# Patient Record
Sex: Female | Born: 1970 | Race: White | Hispanic: No | Marital: Married | Smoking: Never smoker
Health system: Southern US, Community
[De-identification: ages and names within clinical notes are randomized; demographics above are authoritative.]

## PROBLEM LIST (undated history)

## (undated) DIAGNOSIS — T7840XA Allergy, unspecified, initial encounter: Secondary | ICD-10-CM

## (undated) DIAGNOSIS — Z87898 Personal history of other specified conditions: Secondary | ICD-10-CM

## (undated) DIAGNOSIS — K219 Gastro-esophageal reflux disease without esophagitis: Secondary | ICD-10-CM

## (undated) DIAGNOSIS — O2620 Pregnancy care for patient with recurrent pregnancy loss, unspecified trimester: Secondary | ICD-10-CM

## (undated) DIAGNOSIS — O99810 Abnormal glucose complicating pregnancy: Secondary | ICD-10-CM

## (undated) DIAGNOSIS — O0900 Supervision of pregnancy with history of infertility, unspecified trimester: Secondary | ICD-10-CM

## (undated) DIAGNOSIS — Z8619 Personal history of other infectious and parasitic diseases: Secondary | ICD-10-CM

## (undated) HISTORY — PX: WISDOM TOOTH EXTRACTION: SHX21

## (undated) HISTORY — DX: Allergy, unspecified, initial encounter: T78.40XA

## (undated) HISTORY — DX: Gastro-esophageal reflux disease without esophagitis: K21.9

## (undated) HISTORY — PX: DILATION AND CURETTAGE OF UTERUS: SHX78

---

## 2002-11-03 ENCOUNTER — Other Ambulatory Visit: Admission: RE | Admit: 2002-11-03 | Discharge: 2002-11-03 | Payer: Self-pay | Admitting: Obstetrics and Gynecology

## 2003-02-10 ENCOUNTER — Other Ambulatory Visit: Admission: RE | Admit: 2003-02-10 | Discharge: 2003-02-10 | Payer: Self-pay | Admitting: Obstetrics and Gynecology

## 2003-11-11 ENCOUNTER — Other Ambulatory Visit: Admission: RE | Admit: 2003-11-11 | Discharge: 2003-11-11 | Payer: Self-pay | Admitting: Obstetrics and Gynecology

## 2007-03-30 ENCOUNTER — Encounter (INDEPENDENT_AMBULATORY_CARE_PROVIDER_SITE_OTHER): Payer: Self-pay | Admitting: Obstetrics and Gynecology

## 2007-03-30 ENCOUNTER — Ambulatory Visit (HOSPITAL_COMMUNITY): Admission: RE | Admit: 2007-03-30 | Discharge: 2007-03-30 | Payer: Self-pay | Admitting: Obstetrics and Gynecology

## 2007-11-23 ENCOUNTER — Inpatient Hospital Stay (HOSPITAL_COMMUNITY): Admission: AD | Admit: 2007-11-23 | Discharge: 2007-11-23 | Payer: Self-pay | Admitting: Obstetrics and Gynecology

## 2008-02-28 ENCOUNTER — Ambulatory Visit (HOSPITAL_COMMUNITY): Admission: RE | Admit: 2008-02-28 | Discharge: 2008-02-28 | Payer: Self-pay | Admitting: Obstetrics and Gynecology

## 2008-08-29 ENCOUNTER — Inpatient Hospital Stay (HOSPITAL_COMMUNITY): Admission: AD | Admit: 2008-08-29 | Discharge: 2008-08-29 | Payer: Self-pay | Admitting: Obstetrics and Gynecology

## 2008-09-30 ENCOUNTER — Inpatient Hospital Stay (HOSPITAL_COMMUNITY): Admission: AD | Admit: 2008-09-30 | Discharge: 2008-09-30 | Payer: Self-pay | Admitting: Obstetrics and Gynecology

## 2008-12-20 ENCOUNTER — Inpatient Hospital Stay (HOSPITAL_COMMUNITY): Admission: AD | Admit: 2008-12-20 | Discharge: 2008-12-25 | Payer: Self-pay | Admitting: Obstetrics and Gynecology

## 2010-08-27 LAB — CBC
HCT: 27.8 % — ABNORMAL LOW (ref 36.0–46.0)
HCT: 35.2 % — ABNORMAL LOW (ref 36.0–46.0)
Hemoglobin: 12.3 g/dL (ref 12.0–15.0)
Hemoglobin: 9.7 g/dL — ABNORMAL LOW (ref 12.0–15.0)
MCHC: 35 g/dL (ref 30.0–36.0)
MCV: 94.6 fL (ref 78.0–100.0)
MCV: 95.9 fL (ref 78.0–100.0)
RBC: 2.9 MIL/uL — ABNORMAL LOW (ref 3.87–5.11)
WBC: 12.5 10*3/uL — ABNORMAL HIGH (ref 4.0–10.5)
WBC: 13.9 10*3/uL — ABNORMAL HIGH (ref 4.0–10.5)

## 2010-08-27 LAB — URINE CULTURE

## 2010-08-27 LAB — RH IMMUNE GLOB WKUP(>/=20WKS)(NOT WOMEN'S HOSP)

## 2010-08-30 LAB — RH IMMUNE GLOBULIN WORKUP (NOT WOMEN'S HOSP): Antibody Screen: NEGATIVE

## 2010-08-31 LAB — WET PREP, GENITAL
Clue Cells Wet Prep HPF POC: NONE SEEN
Yeast Wet Prep HPF POC: NONE SEEN

## 2010-08-31 LAB — GC/CHLAMYDIA PROBE AMP, GENITAL: GC Probe Amp, Genital: NEGATIVE

## 2010-08-31 LAB — URINALYSIS, ROUTINE W REFLEX MICROSCOPIC
Bilirubin Urine: NEGATIVE
Hgb urine dipstick: NEGATIVE
Ketones, ur: NEGATIVE mg/dL
Nitrite: NEGATIVE
Protein, ur: NEGATIVE mg/dL

## 2010-08-31 LAB — URINE CULTURE
Colony Count: 3000
Special Requests: POSITIVE

## 2010-10-04 NOTE — Op Note (Signed)
NAME:  Julie [name], BRASIL NO.:  [id number]   MEDICAL RECORD NO.:  [id number]          PATIENT TYPE:  AMB   LOCATION:  SDC                           [facility]ILITY:  [facility]   PHYSICIAN:  Hal Morales, M.D.DATE OF BIRTH:  1970-06-07   DATE OF PROCEDURE:  03/30/2007  DATE OF DISCHARGE:                               OPERATIVE REPORT   PREOPERATIVE DIAGNOSIS:  Missed abortion at 13-14 weeks.   POSTOPERATIVE DIAGNOSIS:  Missed abortion at 13-14 weeks.   OPERATION:  Suction dilatation and evacuation.   SURGEON:  Hal Morales, M.D.   ANESTHESIA:  General LMA.   FINDINGS:  The uterus was enlarged to 14-week size with a large amount  of products of conception.   ESTIMATED BLOOD LOSS:  Minimal.   COMPLICATIONS:  None.   SPECIMENS TO PATHOLOGY:  Products of conception.   PROCEDURE:  The patient was taken to the operating room after  appropriate identification and placed on the operating table.  After the  attainment of adequate general anesthesia, she was placed in the  lithotomy position.  Perineum and vagina were prepped with multiple  layers of  Betadine and a catheter used to in-and-out catheterize the  bladder under sterile conditions.  The perineum was draped as a sterile  field.  A Graves speculum was placed in the vagina and a paracervical  block achieved with a total of 10 mL of 2%  Xylocaine in the 5 and  7  o'clock positions.  A single-tooth tenaculum was placed.  The cervix was  dilated first to accommodate a #12 suction curette and this allowed  initiation of the procedure.  The cervix was further dilated to  accommodate a #14 suction curette and this allowed completion of suction  curettage of all quadrants of the uterus.  A transabdominal ultrasound  was used to document that all products of conception had been removed.  The single-tooth tenaculum was then removed and the patient given 0.2 mg  of Methergine IM and Toradol 30 mg IV and 30 mg IM.  She  was then  awakened from general anesthesia after having all instruments removed  from the vagina and taken to the recovery room in satisfactory condition  having tolerated the procedure well with sponge and instrument counts  correct.   SPECIMENS TO PATHOLOGY:  Products of conception.   DISCHARGE INSTRUCTIONS:  Printed instructions from [facility] for  [facility].   DISCHARGE MEDICATIONS:  1. Methergine 0.2 mg p.o. q.6h. for 8 doses.  2. Doxycycline 100 mg p.o. b.i.d. for 5 days.  3. Ibuprofen 600 mg p.o. q.6 h p.r.n. pain.   FOLLOW UP:  The patient is to follow up in 2 weeks at [facility].  Blood type is O negative.  The patient will receive RhoGAM  prior to discharge.      Hal Morales, M.D.  Electronically Signed     VPH/MEDQ  D:  03/30/2007  T:  04/01/2007  Job:  [id number]

## 2010-10-04 NOTE — H&P (Signed)
NAME:  Julie [name], KAFER NO.:  [id number]   MEDICAL RECORD NO.:  [id number]          PATIENT TYPE:  AMB   LOCATION:  SDC                           [facility]   PHYSICIAN:  Hal Morales, M.D.DATE OF BIRTH:  1970/10/15   DATE OF ADMISSION:  03/30/2007  DATE OF DISCHARGE:                              HISTORY & PHYSICAL   HISTORY OF PRESENT ILLNESS:  The patient is a 40 year old white married  female gravida 1, para 0, who presented at approximately 92 weeks'  gestation for a first trimester screen and was found to have an  intrauterine fetal demise.  She presents for D&E for treatment of her  fetal demise.  Her last menstrual period started on December 24, 2006.  Her  estimated date of delivery was Sep 30, 2007.   LABORATORY STUDIES:  Blood type O negative, antibody screen negative.  Hemoglobin 13, platelet count 317,000.  VDRL nonreactive.  Rubella  immune.  HBsAg negative.  HSV1 and 2 both negative.  Pap smear within  normal limits in February.   PAST MEDICAL HISTORY:  Positive for anemia.   PAST SURGICAL HISTORY:  Positive for third molar extraction in 1993.   GYN HISTORY:  Positive for an abnormal Pap smear and colposcopy in 1993  and in 2003.  The patient had spontaneous resolution of her Pap smear to  normal.   FAMILY HISTORY:  Positive for chronic hypertension, diabetes, thyroid  dysfunction, neurologic disorders, colon cancer and mental retardation.   SOCIAL HISTORY:  The patient is married and works as a Dietitian.  She does have any indoor cat.   REVIEW OF SYSTEMS:  Negative for vaginal bleeding.   EXAMINATION:  LUNGS:  Clear.  HEART:  Regular rate and rhythm.  The uterus is 12-14 weeks' size.   Ultrasound shows a crown-rump length of 8.21 cm consistent with 14 weeks  and 1 day.  Cardiac rhythm is absent.   IMPRESSION:  Intrauterine fetal demise at 14 weeks.   DISPOSITION:  A discussion was held with the patient concerning options  for management and she has chosen dilatation and evacuation.  This will  be performed at [facility] on March 30, 2007.  The risks of  anesthesia, bleeding, infection and damage to adjacent organs have been  explained and the patient wishes to proceed.      Hal Morales, M.D.  Electronically Signed     VPH/MEDQ  D:  03/29/2007  T:  03/30/2007  Job:  [id number]

## 2010-10-04 NOTE — Op Note (Signed)
Julie [name], Julie [name] NO.:  [id number]   MEDICAL RECORD NO.:  [id number]          PATIENT TYPE:  INP   LOCATION:  9136                          [facility]ILITY[facility]  [facility]   PHYSICIAN:  Hal Morales, M.D.DATE OF BIRTH:  [birth year]   DATE OF PROCEDURE:  12/22/2008  DATE OF DISCHARGE:                               OPERATIVE REPORT   PREOPERATIVE DIAGNOSES:  Intrauterine pregnancy at 48 weeks' gestation,  failure to descent, suspected macrosomia.   POSTOPERATIVE DIAGNOSES:  Intrauterine pregnancy at 40 weeks, failure to  descent, cephalopelvic disproportion.   PROCEDURE:  Primary low transverse cesarean section.   SURGEON:  Hal Morales, MD   FIRST ASSISTANT:  Larna Daughters, CNM   ANESTHESIA:  Epidural   ESTIMATED BLOOD LOSS:  750 mL.   COMPLICATIONS:  None.   FINDINGS:  The patient was delivered of a female infant weighing 8 pounds  4 ounces with Apgars of 9 and 9 at 1 and 5 minutes respectively.  The  uterus, tubes and ovaries were normal for gravid state.  The placenta  contained an eccentrically inserted three-vessel cord.   PROCEDURE IN DETAILS:  The patient was taken to the operating room after  appropriate identification with the labor epidural and Foley catheter in  place.  She was placed on the operating table while the labor epidural  was dosed for a surgical anesthesia.  The abdomen was prepped with  multiple layers of Betadine and draped as a sterile field.  There was a  left lateral tilt to her supine position.  The suprapubic region was  infiltrated with 20 mL of 0.25% Marcaine once surgical anesthesia had  been ensured.  A transverse incision was made in the abdomen and the  abdomen opened in layers.  The peritoneum was entered and the bladder  blade placed.  The uterus was incised approximately 2 cm above the  uterovesical fold and that incision taken laterally on either side  bluntly.  The infant was delivered from the occiput  transverse position  and after having the nares and pharynx suctioned and cord clamped and  cut was handed off to the awaiting pediatricians.  The placenta was  allowed to separate from the uterus with uterine massage and was removed  from the operative field and handed off to the employees of [employer].  The uterine incision was cleared of products of  conception and the incision closed with a running interlocking suture of  0 Vicryl.  An imbricating suture of 0 Vicryl was placed.  Hemostasis was  achieved with 2 figure-of-eight sutures of 0 Vicryl.  Copious irrigation  was carried out.  The abdominal peritoneum was closed with a running  suture of 2-0 Vicryl.  The rectus muscles were reapproximated in the  midline with a figure-of-eight suture of 2-0 Vicryl.  The rectus fascia  was closed with running sutures of 0 Vicryl from each apex to the  midline and tied in the midline.  The subcutaneous tissue was made  hemostatic with Bovie cautery.  A subcutaneous 7-mm Jackson-Pratt drain  was placed through a stab  incision in left lower quadrant.  The drain  was sewn in place with a suture of 2-0 silk.  The skin incision was  closed with a subcuticular suture of 3-0 Monocryl.  Steri-Strips were  applied and a  sterile dressing applied.  The patient was taken from the operating room  to the recovery room in satisfactory condition having tolerated the  procedure well with sponge and instrument counts correct.  The placenta  went to [facility] and the infant went to the Full-Term Nursery.      Hal Morales, M.D.  Electronically Signed     VPH/MEDQ  D:  12/22/2008  T:  12/22/2008  Job:  [id number]

## 2010-10-04 NOTE — H&P (Signed)
NAME:  Julie [name], Julie [name]            ACCOUNT NO.:  [id number]   MEDICAL RECORD NO.:  [id number]          PATIENT TYPE:  INP   LOCATION:  9167                          [facility]  [facility]   PHYSICIAN:  Naima A. Dillard, M.D. DATE OF BIRTH:  [birth year]   DATE OF ADMISSION:  12/20/2008  DATE OF DISCHARGE:                              HISTORY & PHYSICAL   Julie [name] is a 40 year old gravida 4, para 0, at 61 weeks' gestation  who presents for induction of labor secondary to macrosomia per orders  of Dr. Dierdre Forth.  She is followed by the doctors at [facility] and her pregnancy is remarkable for:  1. Advanced maternal age.  2. Spontaneous AB x3.  3. Rh negative blood type.  4. Labial HSV-1.  5. Intermittent rash.  6. Negative GBS.   PRENATAL LABS:  Julie [name] prenatal labs include an initial hemoglobin  of 13.7, hematocrit 40.7, platelet count 345,000, blood type O negative,  antibody screen negative, toxoplasmosis screen negative, RPR  nonreactive, rubella titer immune, hepatitis B negative, HIV  nonreactive, gonorrhea and chlamydia declined, HSV-1 antibody screen  positive,  HSV-2 antibody screen negative.  Normal AFP, normal first  trimester screen, normal 1-hour Glucola  at 28 weeks with a concurrent  hemoglobin of 11.4 at the same time.  Negative antibody screen with a 28-  week RhoGAM injection.  A final culture for GBS was taken at 35 weeks  which was negative.   ALLERGIES:  Julie [name] does not have any medication allergies; however,  she does have a sensitivity to codeine which causes her to experience  nausea and vomiting.  She does not have any food allergies or latex  allergies.   HISTORY OF PRESENT PREGNANCY:  Julie [name] began prenatal care back in  January at [redacted] weeks gestation.  She was on progesterone suppositories at  that time for her history of spontaneous losses.  She had a normal first  trimester screen.  A few weeks after that, she received the  H1N1 flu  vaccine the third week in January.  At 14 weeks she discontinued her use  of progesterone suppositories.  At 15 weeks she got a rash and had a  normal AFP test.  At 18 weeks she had an ultrasound that showed single  intrauterine pregnancy size concordant with dates, normal fluid and a  cervix of 4.4 cm.  At 20 weeks she had upper respiratory infection which  she managed with over-the-counter medications.  At 22 weeks she began to  measure size greater than dates.  At 24 weeks she was having some  urinary discomfort and was seen in the maternity admissions unit and  given a prescription for Macrobid which she completed, although the  urine culture did come back negative.  At 26 weeks she was measuring 29  cm and had another ultrasound which showed size appropriate for dates,  normal fluid and a cervix of 4.1 cm.  At 28 weeks, she had a normal  Glucola with a 1 hour glucola 69 mg/dL, hemoglobin of 57.8, RPR  nonreactive as well; negative antibody screen  and a RhoGAM injection.  At 31 weeks she had some lesions that were cultured off of her left  labia and HSV-1 and 2,  IgG was tested from old blood which came back  with positive HSV-1 IgG and a negative HSV-2 IgG.  She was given  acyclovir which she stayed on the rest of the pregnancy.  At 32 weeks  she had an ultrasound for size greater than dates again which showed the  baby at this time, transverse, with an AFI in the 85th percentile and a  cervix 4.3 cm.  At 35 weeks, she had a beta strep culture done that was  negative and estimated weight was 5 pounds 12 ounces.  The baby had  turned to vertex presentation and fluid was normal at 67th percentile.  For her most recent ultrasound, we do not have the results of but the  baby was LGA and there was some concern regarding that, which causes her  to be placed for induction of labor at 40 weeks today.   OB HISTORY:  Julie [name] has been pregnant 4 times. Pregnancy #1 ended in   November 2008 with a loss at 13 weeks that required a D and E by Dr.  Pennie Rushing.  She did receive her RhoGAM injection after that.  Pregnancy #2  was an early loss less than 6 weeks in July 2009, she received RhoGAM  after the.  Pregnancy #3 is somewhat equivocal.  She had a positive  urine pregnancy test and then began to bleed and had a negative one  approximately February 2009.  Pregnancy #4 is the current pregnancy.   GYN AND MEDICAL HISTORY:  Julie [name] began menstruation at age 78 with  intervals of every 30 days and cycles lasting 4-5 days.  She is Rh  negative and has had RhoGAM injection multiple times when indicated.  She had some use of oral birth control pills in the past.  She does have  a history of abnormal Pap and colposcopy several years ago.  She has had  a DNA for the 13-week loss.  She did have Chlamydia at age 11 and the  usual childhood illnesses including chickenpox.  She had anemia as a  child.   SURGICAL HISTORY:  Julie [name] had her wisdom teeth extracted in 1993 and  she had a D and E in 2008.   FAMILY HISTORY:  Her paternal grandmother has chronic hypertension and  is on medication for that.  Her mother is diabetic and manages that  with oral medications.  Her mother also has a thyroid condition.  Her  brother has syncope.  Her paternal grandfather has prostate cancer.  Her  paternal grandmother has colon cancer.  Her maternal grandfather had  leukemia.   GENETIC HISTORY:  Julie [name] had a normal AFP test and a normal first  trimester screen.  She does not have any congenital problems or genetic  diseases in her side of the family.  The father of the baby has one  first cousin with mental retardation of unknown etiology.   SOCIAL HISTORY:  Julie [name] is Caucasian with a bachelor's degree as is  her husband, Juline Sanderford.  She works as a Tourist information centre manager.  He works at [employer].  She denies use of any alcohol, tobacco or street drugs  during this  pregnancy.   PHYSICAL EXAMINATION:  Within normal limits this evening.  She is  afebrile with stable vital signs.  The fetal heart  rate has a baseline  of 130 beats per minute, variability is present and accelerations are  also present meeting the 15 x 15 criteria for reactivity.  There are no  decelerations present.  There are irregular mild contractions which Ms.  Montez [name] is completely unaware of.  LUNGS:  Clear to auscultation bilaterally.  HEENT:  Normal.  HEART:  Regular rate and rhythm.  No murmur.  BREASTS:  Soft and nontender.  ABDOMEN:  Gravid, somewhat LGA.  Seems to have a lot of amniotic fluid  per palpation of the uterus.  There is soft uterine resting tone.  She does have +1 edema of upper and lower extremities with normal DTRs  and negative Homans' sign.  Her cervix is fingertip dilated, 20% effaced, anterior and quite firm in  consistency.   IMPRESSION:  A 40 year old G4, P0 at 40 weeks, Rh negative blood type,  advanced maternal age, large for gestational age, okay for a Cytotec  induction of labor.   PLAN:  Cytotec 25 mcg q.4 hours per protocol until Pitocin is indicated.  First Cytotec dose has been placed.  The patient is being admitted to MD  services with routine orders.      Janna Melsness, CNM      Naima A. Normand Sloop, M.D.  Electronically Signed    JM/MEDQ  D:  12/20/2008  T:  12/21/2008  Job:  [id number]

## 2010-10-04 NOTE — Discharge Summary (Signed)
[name], [name] NO.:  [id number]   MEDICAL RECORD NO.:  [id number]          PATIENT TYPE:  INP   LOCATION:  9136                          [facility]   PHYSICIAN:  Janine Limbo, M.D.DATE OF BIRTH:  1970/10/05   DATE OF ADMISSION:  12/20/2008  DATE OF DISCHARGE:  12/25/2008                               DISCHARGE SUMMARY   ADMITTING DIAGNOSES:  1. Intrauterine pregnancy at 40 weeks'.  2. Advanced maternal age.  3. Rh negative.  4. Postdates.  5. Negative group B strep.   DISCHARGE DIAGNOSES:  1. Intrauterine pregnancy at 40 weeks'.  2. Fair to descend.   PROCEDURES:  1. Primary low transverse cesarean section.  2. Epidural anesthesia.   HOSPITAL COURSE:  Julie [name] is a 40 year old, gravida 4, para 0-0-3-0  at 46 weeks' who presented for induction on the evening of December 20, 2008, secondary to macrosomia.  Her pregnancy had been remarkable for:  1. Advanced maternal age.  2. SAB x3.  3. Rh negative.  4. Labial HSV 1.  5. Negative group B strep.  6. Recurrent UTIs.   On admission, she was having some irregular contractions.  Cervix was  fingertip, 20% anterior firm.  Fetal heart rate was reactive.  The  patient had had an estimated fetal weight done on her most recent  ultrasound with a concern for developing macrosomia.  She was admitted.  Cytotec was placed.  By the next morning, she was 1 cm at 7:30.  Spontaneous rupture of membranes had occurred at 5:30 with clear fluid.  Pitocin augmentation was begun.  Epidural was placed by the early  afternoon.  At that time, her cervix was 2, 90% vertex at a -1 station.  By 04:45 she was 4, 80% with some puffiness of the cervix.  Fetal heart  rate had some intermittent decelerations.  Intrauterine pressure  catheter were showing inadequate labor.  Amnioinfusion was reviewed at  the possibility of therapy.  By 10:00 p.m., the patient had one spike of  temperature to 100.9, however, this  resolved after Tylenol and 1 Unasyn  dose.  The cervix at that time was 6, 90%, vertex -1.  There were some  episodes of repetitive late decelerations.  However, there was also  intermittent episodes of reactivity.  Amnioinfusion had been begun.  The  options for manage were reviewed with the patient including restarting  Pitocin or proceeding to cesarean section.  The patient wished to  restart the Pitocin and observe for results.  The patient then made slow  change to an anterior lip at 2:45 in the  morning with the fetus at 0  station.  Fetal heart rate remained stable throughout that time.  She  was re-dosed on her Unasyn.  She began pushing at approximately 3:45.  However, by 6:00 a.m. she had been pushing well for 2 hours without any  significant descent from 0 station.  C-section was recommended at that  time and was accepted.  The patient was taken to the operating room,  where Dr. Pennie Rushing performed a primary low transverse cesarean section  under  existing epidural anesthesia.  Findings were a viable female, weight  8 pounds 4 ounces by the name of Julie [name], Apgars were 9 and 9.  Infant was  taken to the full-term nursery.  Mother was taken to recovery in good  condition.   By postop day #1, the patient is doing well.  She was up ad lib.  Her  hemoglobin was 9.7, platelet count was 207, and white blood cell count  was 13.9.  Her pre delivery hemoglobin had been at 12.3.  However,  orthostatics were within normal limits.  She was having no dizziness or  syncope.  Her incision was clean, dry, and intact.  She did have a JP  drain that was draining a small amount of serosanguineous fluid.  She  was breast-feeding.  Rest of her hospital course was uncomplicated.  By  postop day #3, she was doing well.  She was having some questionable  urinary urgency and a slight amount of dysuria, however, she was unsure  of this was related to UTI-type symptoms or more pressure on the  bladder.  She had  had a history of urinary tract infections in the past.  She was afebrile in light of a long second stage of her labor as well as  urinary catheterization during that time and her history of UTI, the  decision was made to go ahead and put her on prophylactic antibiotic  course post-delivery.  Keflex was elected due to the need to avoid  increased risk of jaundice with Macrobid or Bactrim.  Her JP drain had a  minimal amount of drainage.  It was removed without difficulty.  Her  incision was clean, dry, and intact.  She was deemed to receive full  benefit of her hospital stay.  She was planning on using condoms.  She  was discharged home in stable condition.  Discharge instructions per  [facility] handout.  Urine was sent for culture today   DISCHARGE MEDICATIONS:  1. Motrin 600 mg p.o. q.6 h. p.r.n. pain.  2. Darvocet-N 100 one to two p.o. q.2-4 h. p.r.n. pain.  3. Keflex 500 mg 1 p.o. b.i.d.   DISCHARGE FOLLOWUP:  Discharge followup will occur in 6 weeks at [facility].      Julie [name], C.N.M.      Janine Limbo, M.D.  Electronically Signed    VLL/MEDQ  D:  12/25/2008  T:  12/25/2008  Job:  [id number]

## 2011-02-16 LAB — RH IMMUNE GLOBULIN WORKUP (NOT WOMEN'S HOSP): Antibody Screen: NEGATIVE

## 2011-02-28 LAB — CBC: MCV: 89.7

## 2011-02-28 LAB — RH IMMUNE GLOBULIN WORKUP (NOT WOMEN'S HOSP)

## 2011-02-28 LAB — ABO/RH: ABO/RH(D): O NEG

## 2011-07-17 ENCOUNTER — Other Ambulatory Visit: Payer: Self-pay | Admitting: Obstetrics and Gynecology

## 2011-07-17 DIAGNOSIS — N6019 Diffuse cystic mastopathy of unspecified breast: Secondary | ICD-10-CM

## 2011-07-17 DIAGNOSIS — N644 Mastodynia: Secondary | ICD-10-CM

## 2011-07-17 LAB — OB RESULTS CONSOLE RUBELLA ANTIBODY, IGM: Rubella: IMMUNE

## 2011-07-17 LAB — OB RESULTS CONSOLE HIV ANTIBODY (ROUTINE TESTING): HIV: NONREACTIVE

## 2011-07-19 ENCOUNTER — Ambulatory Visit
Admission: RE | Admit: 2011-07-19 | Discharge: 2011-07-19 | Disposition: A | Discharge status: Home / Self Care | Payer: Private Health Insurance - Indemnity | Source: Ambulatory Visit | Attending: Obstetrics and Gynecology | Admitting: Obstetrics and Gynecology

## 2011-07-19 DIAGNOSIS — N6019 Diffuse cystic mastopathy of unspecified breast: Secondary | ICD-10-CM

## 2011-07-19 DIAGNOSIS — N644 Mastodynia: Secondary | ICD-10-CM

## 2011-08-01 ENCOUNTER — Other Ambulatory Visit: Payer: Self-pay

## 2011-08-01 ENCOUNTER — Other Ambulatory Visit (INDEPENDENT_AMBULATORY_CARE_PROVIDER_SITE_OTHER): Payer: Private Health Insurance - Indemnity

## 2011-08-01 DIAGNOSIS — Z36 Encounter for antenatal screening of mother: Secondary | ICD-10-CM

## 2011-08-16 ENCOUNTER — Encounter (INDEPENDENT_AMBULATORY_CARE_PROVIDER_SITE_OTHER): Payer: Private Health Insurance - Indemnity | Admitting: Obstetrics and Gynecology

## 2011-08-16 DIAGNOSIS — Z3480 Encounter for supervision of other normal pregnancy, unspecified trimester: Secondary | ICD-10-CM

## 2011-09-08 ENCOUNTER — Encounter: Payer: Self-pay | Admitting: Obstetrics and Gynecology

## 2011-09-08 ENCOUNTER — Ambulatory Visit (INDEPENDENT_AMBULATORY_CARE_PROVIDER_SITE_OTHER): Payer: Private Health Insurance - Indemnity | Admitting: Obstetrics and Gynecology

## 2011-09-08 ENCOUNTER — Telehealth: Payer: Self-pay | Admitting: Obstetrics and Gynecology

## 2011-09-08 VITALS — BP 112/78 | Temp 98.6°F | Wt 195.0 lb

## 2011-09-08 DIAGNOSIS — B379 Candidiasis, unspecified: Secondary | ICD-10-CM

## 2011-09-08 DIAGNOSIS — R300 Dysuria: Secondary | ICD-10-CM

## 2011-09-08 DIAGNOSIS — B49 Unspecified mycosis: Secondary | ICD-10-CM

## 2011-09-08 LAB — POCT URINALYSIS DIPSTICK
Blood, UA: NEGATIVE
Glucose, UA: NEGATIVE
Ketones, UA: NEGATIVE
Leukocytes, UA: NEGATIVE
Protein, UA: NEGATIVE
Spec Grav, UA: 1.01
Urobilinogen, UA: NEGATIVE

## 2011-09-08 LAB — POCT WET PREP (WET MOUNT)
Clue Cells Wet Prep Whiff POC: NEGATIVE
pH: 4.5

## 2011-09-08 MED ORDER — TERCONAZOLE 80 MG VA SUPP: 80.0000 mg | Freq: Every day | VAGINAL | Status: AC

## 2011-09-08 MED ORDER — NYSTATIN-TRIAMCINOLONE 100000-0.1 UNIT/GM-% EX OINT: TOPICAL_OINTMENT | Freq: Four times a day (QID) | CUTANEOUS | Status: DC | PRN

## 2011-09-08 NOTE — Telephone Encounter (Signed)
TC TO PT. PT C/O VULVAR BURNING AND PAIN. NO UTI SX'S. PT ? IF IS A YEAST INFECTION. PT PREG WITH TWINS. NO FEVER. APPT SCHED TODAY FOR EVAL WITH SL. PT AGREES.

## 2011-09-08 NOTE — Patient Instructions (Signed)
Candida Infection, Adult A candida infection (also called yeast, fungus and Monilia infection) is an overgrowth of yeast that can occur anywhere on the body. A yeast infection commonly occurs in warm, moist body areas. Usually, the infection remains localized but can spread to become a systemic infection. A yeast infection may be a sign of a more severe disease such as diabetes, leukemia, or AIDS. A yeast infection can occur in both men and women. In women, Candida vaginitis is a vaginal infection. It is one of the most common causes of vaginitis. Men usually do not have symptoms or know they have an infection until other problems develop. Men may find out they have a yeast infection because their sex partner has a yeast infection. Uncircumcised men are more likely to get a yeast infection than circumcised men. This is because the uncircumcised glans is not exposed to air and does not remain as dry as that of a circumcised glans. Older adults may develop yeast infections around dentures. CAUSES  Women  Antibiotics.   Steroid medication taken for a long time.   Being overweight (obese).   Diabetes.   Poor immune condition.   Certain serious medical conditions.   Immune suppressive medications for organ transplant patients.   Chemotherapy.   Pregnancy.   Menstration.   Stress and fatigue.   Intravenous drug use.   Oral contraceptives.   Wearing tight-fitting clothes in the crotch area.   Catching it from a sex partner who has a yeast infection.   Spermicide.   Intravenous, urinary, or other catheters.  Men  Catching it from a sex partner who has a yeast infection.   Having oral or anal sex with a person who has the infection.   Spermicide.   Diabetes.   Antibiotics.   Poor immune system.   Medications that suppress the immune system.   Intravenous drug use.   Intravenous, urinary, or other catheters.  SYMPTOMS  Women  Thick, white vaginal discharge.    Vaginal itching.   Redness and swelling in and around the vagina.   Irritation of the lips of the vagina and perineum.   Blisters on the vaginal lips and perineum.   Painful sexual intercourse.   Low blood sugar (hypoglycemia).   Painful urination.   Bladder infections.   Intestinal problems such as constipation, indigestion, bad breath, bloating, increase in gas, diarrhea, or loose stools.  Men  Men may develop intestinal problems such as constipation, indigestion, bad breath, bloating, increase in gas, diarrhea, or loose stools.   Dry, cracked skin on the penis with itching or discomfort.   Jock itch.   Dry, flaky skin.   Athlete's foot.   Hypoglycemia.  DIAGNOSIS  Women  A history and an exam are performed.   The discharge may be examined under a microscope.   A culture may be taken of the discharge.  Men  A history and an exam are performed.   Any discharge from the penis or areas of cracked skin will be looked at under the microscope and cultured.   Stool samples may be cultured.  TREATMENT  Women  Vaginal antifungal suppositories and creams.   Medicated creams to decrease irritation and itching on the outside of the vagina.   Warm compresses to the perineal area to decrease swelling and discomfort.   Oral antifungal medications.   Medicated vaginal suppositories or cream for repeated or recurrent infections.   Wash and dry the irritation areas before applying the cream.     Eating yogurt with lactobacillus may help with prevention and treatment.   Sometimes painting the vagina with gentian violet solution may help if creams and suppositories do not work.  Men  Antifungal creams and oral antifungal medications.   Sometimes treatment must continue for 30 days after the symptoms go away to prevent recurrence.  HOME CARE INSTRUCTIONS  Women  Use cotton underwear and avoid tight-fitting clothing.   Avoid colored, scented toilet paper and  deodorant tampons or pads.   Do not douche.   Keep your diabetes under control.   Finish all the prescribed medications.   Keep your skin clean and dry.   Consume milk or yogurt with lactobacillus active culture regularly. If you get frequent yeast infections and think that is what the infection is, there are over-the-counter medications that you can get. If the infection does not show healing in 3 days, talk to your caregiver.   Tell your sex partner you have a yeast infection. Your partner may need treatment also, especially if your infection does not clear up or recurs.  Men  Keep your skin clean and dry.   Keep your diabetes under control.   Finish all prescribed medications.   Tell your sex partner that you have a yeast infection so they can be treated if necessary.  SEEK MEDICAL CARE IF:   Your symptoms do not clear up or worsen in one week after treatment.   You have an oral temperature above 102 F (38.9 C).   You have trouble swallowing or eating for a prolonged time.   You develop blisters on and around your vagina.   You develop vaginal bleeding and it is not your menstrual period.   You develop abdominal pain.   You develop intestinal problems as mentioned above.   You get weak or lightheaded.   You have painful or increased urination.   You have pain during sexual intercourse.  MAKE SURE YOU:   Understand these instructions.   Will watch your condition.   Will get help right away if you are not doing well or get worse.  Document Released: 06/15/2004 Document Revised: 04/27/2011 Document Reviewed: 09/27/2009 [employer] Patient Information 2012 [employer]. 

## 2011-09-11 ENCOUNTER — Other Ambulatory Visit: Payer: Self-pay

## 2011-09-11 ENCOUNTER — Telehealth: Payer: Self-pay | Admitting: Obstetrics and Gynecology

## 2011-09-11 DIAGNOSIS — Z331 Pregnant state, incidental: Secondary | ICD-10-CM

## 2011-09-11 NOTE — Progress Notes (Signed)
Patient ID: Julie [name], female   DOB: 07-12-1970, 41 y.o.   MRN: [id number]  [redacted]w[redacted]d - with twins  Pt here today w c/o ? UTI Has burning w urination and vulva feels irriated Denies any cramping or ctx, no VB or LOF  VSS Gen: no distress Abdomen: soft, non-tender  Pelvic - spec exam, cx and vaginal normal Vulva erythematous Wet prep +hyphae  Ve=cl/th/high  FHT's x2 150's Urinalysis    Component Value Date/Time   COLORURINE YELLOW 08/29/2008 1216   APPEARANCEUR CLEAR 08/29/2008 1216   LABSPEC <1.005* 08/29/2008 1216   PHURINE 4.5 09/08/2011 1558   GLUCOSEU NEGATIVE 08/29/2008 1216   HGBUR NEGATIVE 08/29/2008 1216   BILIRUBINUR negative 09/08/2011 1504   BILIRUBINUR NEGATIVE 08/29/2008 1216   KETONESUR NEGATIVE 08/29/2008 1216   PROTEINUR NEGATIVE 08/29/2008 1216   UROBILINOGEN negative 09/08/2011 1504   UROBILINOGEN 0.2 08/29/2008 1216   NITRITE negative 09/08/2011 1504   NITRITE NEGATIVE 08/29/2008 1216   LEUKOCYTESUR Negative 09/08/2011 1504         A: Yeast infection  P: terazol 3 mycolog externally Has appt for anat scan and visit in May Will send UA for cx

## 2011-09-11 NOTE — Telephone Encounter (Signed)
TC TO PT PER TELEPHONE CALL. PT STATES,"COMPLETED ANTI FUNGAL CREAM PRES LAST PM AND ALSO USES AN EXTERNAL CREAM FOR IRRITATION,". PT STILL C/O MILD IRRITATION. INFORMED PT TO CONTINUE USE OF EXTERNAL CREAM AS DIRECTED AND CB IF SX'S STILL PERSIST OR INC. ADVISED TO KEEP VAGINAL AREA CLEAN AND DRY. PT VOICES UNDERSTANDING. ROB APPT SCHED 09/08/11 WITH SR AND U/S.

## 2011-09-11 NOTE — Telephone Encounter (Signed)
ROUTED TO CHANDRA 

## 2011-09-15 ENCOUNTER — Other Ambulatory Visit: Payer: Private Health Insurance - Indemnity

## 2011-09-15 ENCOUNTER — Ambulatory Visit (INDEPENDENT_AMBULATORY_CARE_PROVIDER_SITE_OTHER): Payer: Private Health Insurance - Indemnity

## 2011-09-15 ENCOUNTER — Other Ambulatory Visit: Payer: Self-pay | Admitting: Obstetrics and Gynecology

## 2011-09-15 ENCOUNTER — Ambulatory Visit (INDEPENDENT_AMBULATORY_CARE_PROVIDER_SITE_OTHER): Payer: Private Health Insurance - Indemnity | Admitting: Obstetrics and Gynecology

## 2011-09-15 ENCOUNTER — Ambulatory Visit (HOSPITAL_COMMUNITY)
Admission: RE | Admit: 2011-09-15 | Discharge: 2011-09-15 | Disposition: A | Discharge status: Home / Self Care | Payer: Private Health Insurance - Indemnity | Source: Ambulatory Visit | Attending: Obstetrics and Gynecology | Admitting: Obstetrics and Gynecology

## 2011-09-15 ENCOUNTER — Encounter: Payer: Self-pay | Admitting: Obstetrics and Gynecology

## 2011-09-15 DIAGNOSIS — O360990 Maternal care for other rhesus isoimmunization, unspecified trimester, not applicable or unspecified: Secondary | ICD-10-CM

## 2011-09-15 DIAGNOSIS — Z363 Encounter for antenatal screening for malformations: Secondary | ICD-10-CM | POA: Insufficient documentation

## 2011-09-15 DIAGNOSIS — O30009 Twin pregnancy, unspecified number of placenta and unspecified number of amniotic sacs, unspecified trimester: Secondary | ICD-10-CM | POA: Insufficient documentation

## 2011-09-15 DIAGNOSIS — O09529 Supervision of elderly multigravida, unspecified trimester: Secondary | ICD-10-CM | POA: Insufficient documentation

## 2011-09-15 DIAGNOSIS — IMO0001 Reserved for inherently not codable concepts without codable children: Secondary | ICD-10-CM

## 2011-09-15 DIAGNOSIS — O26899 Other specified pregnancy related conditions, unspecified trimester: Secondary | ICD-10-CM | POA: Insufficient documentation

## 2011-09-15 DIAGNOSIS — O358XX0 Maternal care for other (suspected) fetal abnormality and damage, not applicable or unspecified: Secondary | ICD-10-CM | POA: Insufficient documentation

## 2011-09-15 DIAGNOSIS — O2690 Pregnancy related conditions, unspecified, unspecified trimester: Secondary | ICD-10-CM

## 2011-09-15 DIAGNOSIS — O34219 Maternal care for unspecified type scar from previous cesarean delivery: Secondary | ICD-10-CM | POA: Insufficient documentation

## 2011-09-15 DIAGNOSIS — Z331 Pregnant state, incidental: Secondary | ICD-10-CM

## 2011-09-15 DIAGNOSIS — O3440 Maternal care for other abnormalities of cervix, unspecified trimester: Secondary | ICD-10-CM | POA: Insufficient documentation

## 2011-09-15 DIAGNOSIS — O2620 Pregnancy care for patient with recurrent pregnancy loss, unspecified trimester: Secondary | ICD-10-CM | POA: Insufficient documentation

## 2011-09-15 DIAGNOSIS — Z9889 Other specified postprocedural states: Secondary | ICD-10-CM

## 2011-09-15 DIAGNOSIS — A6000 Herpesviral infection of urogenital system, unspecified: Secondary | ICD-10-CM | POA: Insufficient documentation

## 2011-09-15 DIAGNOSIS — Z98891 History of uterine scar from previous surgery: Secondary | ICD-10-CM | POA: Insufficient documentation

## 2011-09-15 DIAGNOSIS — Z1389 Encounter for screening for other disorder: Secondary | ICD-10-CM | POA: Insufficient documentation

## 2011-09-15 DIAGNOSIS — Z6791 Unspecified blood type, Rh negative: Secondary | ICD-10-CM | POA: Insufficient documentation

## 2011-09-15 LAB — US OB DETAIL + 14 WK

## 2011-09-15 LAB — US OB DETAIL ADDL GEST + 14 WK

## 2011-09-15 NOTE — Progress Notes (Signed)
Pt c/o vaginal irrititation not resolved no relief with Terazol. Mole under L breast. Baby A's heart rate not recorded on today's u/s.

## 2011-09-15 NOTE — Progress Notes (Signed)
Patient ID: Julie [name], female   DOB: 1970-11-11, 41 y.o.   MRN: [id number]  This is a 41 year old married Caucasian woman presenting today for a second trimester screening ultrasound.  She is known for a monochorionic diamniotic twin pregnancy per first trimester ultrasound.  Ultrasound today reveals discordant growth between the twins as well as discordant fluid.  Twin A: Size equal dates, 49th percentile, with subjectively increased amniotic fluid, normal anatomy survey and female gender.  Twin B: Size less than dates at 25th percentile, with subjectively decreased amniotic fluid, normal anatomy survey and female gender and a marginally inserted umbilical cord.    The placenta is posterior.  Cervix measures 3.94 cm.  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  These findings have been thoroughly reviewed with the mother and father of the baby.  The patient has been referred to maternal fetal medicine today for 20/6/13 at 1:45 PM to confirm diagnosis and clarify plan of care.

## 2011-09-15 NOTE — Progress Notes (Signed)
Julie [name] was seen for ultrasound appointment today.  Please see [facility] report for details.

## 2011-09-18 ENCOUNTER — Encounter: Payer: Self-pay | Admitting: Obstetrics and Gynecology

## 2011-09-18 NOTE — Progress Notes (Signed)
Patient ID: Julie [name], female   DOB: 10-02-1970, 41 y.o.   MRN: [id number]  Seen by Dr Otho Perl on 09/15/11. 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  Patient referred to [facility] for Laser ablation.

## 2011-09-20 ENCOUNTER — Encounter: Payer: Self-pay | Admitting: Maternal and Fetal Medicine

## 2011-09-20 LAB — US OB COMP + 14 WK

## 2011-09-20 NOTE — Telephone Encounter (Signed)
Routed to chandra 

## 2011-09-21 NOTE — Telephone Encounter (Signed)
Tc to pt per telephone call. Told pt does not need to keep appt on 09-22-11. Vph will fu with pt rgdg poc. Pt voices understanding.

## 2011-09-22 ENCOUNTER — Encounter: Payer: Private Health Insurance - Indemnity | Admitting: Obstetrics and Gynecology

## 2011-09-22 ENCOUNTER — Telehealth: Payer: Self-pay | Admitting: Obstetrics and Gynecology

## 2011-09-22 NOTE — Telephone Encounter (Signed)
Telephone call to [facility] for info re her visit at [facility] on 09/20/11 for concern re TTTS. Pt relayed the following info until we receive a written report from [facility]: 1) They were told that TTTS was mild 2) Size difference in the babies may be more due to marginal insertion of cord in smaller baby 3) Amnioreduction was done from the larger AF volume.  Pt tolerated procedure well.  She denies any vaginal leakage of fluid 4) She will return for appt at [facility] on 09/26/11. 5) She will call us after that appt with info re her next appt there so that we can arrange any needed f/uin our office.

## 2011-09-26 ENCOUNTER — Telehealth: Payer: Self-pay | Admitting: Obstetrics and Gynecology

## 2011-09-26 NOTE — Progress Notes (Signed)
No show

## 2011-09-26 NOTE — Telephone Encounter (Signed)
Pc to pt rgdg telephone call. Pt wanted vph to be aware that she had her appt today with [facility]. Next fu appt sched 10/04/11 with facility. Will make vph aware. Will call pt back to sched pt's next ROB in office. Pt voices understanding.

## 2011-09-26 NOTE — Telephone Encounter (Signed)
Chandra/VPH pt/electronic

## 2011-10-02 NOTE — Telephone Encounter (Signed)
Tc to pt per telephone call. Pt states,"has tried Chloraseptic spray, salt water gargle rinses, and Tylenol for sore throat w/o improvement snce 09-29-11". C/o cough. No fever. Informed pt to try otc plain Robitussin or plain Mucinex for cough. Can try otc Ibuprofen 600mg  every 6 hrs x 24 hours for sore throat. Cont Chloraseptic spray and salt water gargle rinses. If no improvement w/i 24 hours, pt to call office back.

## 2011-10-02 NOTE — Telephone Encounter (Signed)
Chandra/epic/cld last wk for same sympt

## 2011-10-03 ENCOUNTER — Telehealth: Payer: Self-pay

## 2011-10-03 ENCOUNTER — Encounter: Payer: Self-pay | Admitting: Obstetrics and Gynecology

## 2011-10-03 ENCOUNTER — Ambulatory Visit (INDEPENDENT_AMBULATORY_CARE_PROVIDER_SITE_OTHER): Payer: Private Health Insurance - Indemnity | Admitting: Obstetrics and Gynecology

## 2011-10-03 VITALS — BP 104/70 | Temp 98.2°F | Ht 62.0 in | Wt 198.0 lb

## 2011-10-03 DIAGNOSIS — J029 Acute pharyngitis, unspecified: Secondary | ICD-10-CM | POA: Insufficient documentation

## 2011-10-03 DIAGNOSIS — R590 Localized enlarged lymph nodes: Secondary | ICD-10-CM

## 2011-10-03 DIAGNOSIS — IMO0002 Reserved for concepts with insufficient information to code with codable children: Secondary | ICD-10-CM

## 2011-10-03 DIAGNOSIS — H109 Unspecified conjunctivitis: Secondary | ICD-10-CM | POA: Insufficient documentation

## 2011-10-03 LAB — RAPID STREP SCREEN (MED CTR MEBANE ONLY): Streptococcus, Group A Screen (Direct): NEGATIVE

## 2011-10-03 MED ORDER — PENICILLIN V POTASSIUM 250 MG PO TABS: 250.0000 mg | ORAL_TABLET | Freq: Four times a day (QID) | ORAL | Status: AC

## 2011-10-03 MED ORDER — TOBRAMYCIN 0.3 % OP SOLN: 1.0000 [drp] | OPHTHALMIC | Status: AC

## 2011-10-03 NOTE — Patient Instructions (Signed)
Conjunctivitis Conjunctivitis is commonly called "pink eye." Conjunctivitis can be caused by bacterial or viral infection, allergies, or injuries. There is usually redness of the lining of the eye, itching, discomfort, and sometimes discharge. There may be deposits of matter along the eyelids. A viral infection usually causes a watery discharge, while a bacterial infection causes a yellowish, thick discharge. Pink eye is very contagious and spreads by direct contact. You may be given antibiotic eyedrops as part of your treatment. Before using your eye medicine, remove all drainage from the eye by washing gently with warm water and cotton balls. Continue to use the medication until you have awakened 2 mornings in a row without discharge from the eye. Do not rub your eye. This increases the irritation and helps spread infection. Use separate towels from other household members. Wash your hands with soap and water before and after touching your eyes. Use cold compresses to reduce pain and sunglasses to relieve irritation from light. Do not wear contact lenses or wear eye makeup until the infection is gone. SEEK MEDICAL CARE IF:   Your symptoms are not better after 3 days of treatment.   You have increased pain or trouble seeing.   The outer eyelids become very red or swollen.  Document Released: 06/15/2004 Document Revised: 04/27/2011 Document Reviewed: 05/08/2005 [employer] Patient Information 2012 [employer]. 

## 2011-10-03 NOTE — Telephone Encounter (Signed)
Tc to pt per telephone call. Pt c/o cough and sore throat for approx 2-3days. No fever. Pt tried otc cough meds with mild improvement;however has noticed pink patches in back of throat. Pt concerned that she may also have the pink eye. Appt sched today with vph for eval. Pt agrees

## 2011-10-03 NOTE — Progress Notes (Signed)
Subjective:     Patient ID: Julie [name], female   DOB: June 19, 1970, 41 y.o.   MRN: [id number]  HPI The patient complains of a three-day history of progressive sore throat without significant fever and nonproductive cough. She has some difficulty swallowing. She has a one-day history of left eye reddening and increased moisture. Her son had conjunctivitis a week ago. He has used his topical antibiotics and is improving.  Review of SystemsNo fever or chills.  No productive cough. No chest pain.  No wheezing.    No eye pain.  No blurred vision.       Objective:   Physical Exam BP 104/70  Temp 98.2 F (36.8 C)  Wt 198 lb (89.812 kg)  LMP 05/03/2011 Left eye: Conjunctiva erythematous.  Sclera erythematous.  Small amount of exudate.  Pharynx: Erythema.no tonsillar exudate.  Bilateral tender submandibular adenopathy   Assessment:     #1 bacterial conjunctivitis #2 bacterial versus viral pharyngitis    Plan:     #1 rapid strep culture sent #2 tobramycin solution  to the left eye for 10 days #3 pen VK orally for 10 days #56followup in 2 weeks or as needed #5 keep appointment with perinatology at [facility] on 5/:15/ 2013

## 2011-10-05 ENCOUNTER — Telehealth: Payer: Self-pay | Admitting: Obstetrics and Gynecology

## 2011-10-05 NOTE — Telephone Encounter (Signed)
Tc to pt per telephone call. Pt wanted vph to know that,"the smaller baby gained 2cm of fluid and the bigger baby gained 0.1cm of fluid within the past 2 weeks". Future appts sched 10/10/11, 10/18/11, and 10/25/11. Informed pt will make vph aware and that we will receive a report about this visit for vph to review. Pt voices understanding.

## 2011-10-05 NOTE — Telephone Encounter (Signed)
Chandra/last seen by vph/epic

## 2011-10-11 NOTE — Telephone Encounter (Signed)
Chandra/VPH pt 

## 2011-10-11 NOTE — Telephone Encounter (Signed)
Tc from pt per telephone call. Pt seen @[facility]. Pt states,"no major changes has occurred and things are stable at this time". Informed pt will make vph aware and awaiting u/s result from this weeks visit. Appt sched 5/29-13 for growth u/s and visit with [facility] per pt. Pt agrees.

## 2011-10-11 NOTE — Telephone Encounter (Signed)
Lm on vm to cb per telephone call.  

## 2011-10-19 ENCOUNTER — Encounter: Payer: Private Health Insurance - Indemnity | Admitting: Obstetrics and Gynecology

## 2011-11-01 ENCOUNTER — Telehealth: Payer: Self-pay | Admitting: Obstetrics and Gynecology

## 2011-11-01 NOTE — Telephone Encounter (Signed)
Dr. Lynnell Chad called to relay information concerning the patient's visit today EGA :  25 weeks and 5 day Diagnosis: Twin-twin transfusion syndrome mild   Marginal insertion of the cord   Significant twin discordance partially associated with marginal insertion Recommendations: Weekly AFI.  Prepare patient for delivery if the smaller instrument shows no bladder on ultrasound or a fluid pocket of less than 2 cm     Growth ultrasounds every 2 weeks. If the estimated fetal weight is less than 10th percentile, begin Doppler study.  Because the discordance between the twins is already greater than 20% this will not be used as a criterion for delivery    Consider steroids at 30-[redacted] weeks gestation if the patient continues to do well prior to that time.  He concurs that these studies can be performed in our office and we can consult our local maternal fetal medicine specialist as needed

## 2011-11-02 ENCOUNTER — Other Ambulatory Visit: Payer: Self-pay

## 2011-11-02 DIAGNOSIS — O409XX0 Polyhydramnios, unspecified trimester, not applicable or unspecified: Secondary | ICD-10-CM

## 2011-11-07 ENCOUNTER — Ambulatory Visit (INDEPENDENT_AMBULATORY_CARE_PROVIDER_SITE_OTHER): Payer: Private Health Insurance - Indemnity | Admitting: Obstetrics and Gynecology

## 2011-11-07 ENCOUNTER — Encounter: Payer: Self-pay | Admitting: Obstetrics and Gynecology

## 2011-11-07 ENCOUNTER — Other Ambulatory Visit: Payer: Self-pay | Admitting: Obstetrics and Gynecology

## 2011-11-07 ENCOUNTER — Ambulatory Visit (INDEPENDENT_AMBULATORY_CARE_PROVIDER_SITE_OTHER): Payer: Private Health Insurance - Indemnity

## 2011-11-07 VITALS — BP 120/78 | Wt 206.0 lb

## 2011-11-07 DIAGNOSIS — IMO0001 Reserved for inherently not codable concepts without codable children: Secondary | ICD-10-CM

## 2011-11-07 DIAGNOSIS — O30009 Twin pregnancy, unspecified number of placenta and unspecified number of amniotic sacs, unspecified trimester: Secondary | ICD-10-CM

## 2011-11-07 DIAGNOSIS — O409XX0 Polyhydramnios, unspecified trimester, not applicable or unspecified: Secondary | ICD-10-CM

## 2011-11-07 NOTE — Progress Notes (Addendum)
The patient is a 41 year old who presents at 26 weeks and 6 days gestation.  Her pregnancy is complicated by:  1.  Monoamniotic and dichorionic twin gestation with stage I twin-twin transfusion.  The patient has been followed in [place] where she had an amnio reduction on Sep 20, 2011. Twin A is the recipient twin.  Twin B is the donor twin.  Twin B has a marginal versus velamentous cord insertion. The most recent visit note and ultrasound from [place] are not available to me.  However, the physician did call with a report of the visit. The recommendations are as follows:   - Weekly AFI.  - Prepare patient for delivery if the smaller infant shows no bladder on ultrasound or a fluid pocket of less than 2 cm.  - Growth ultrasounds every 2 weeks. If the estimated fetal weight is less than 10th percentile, begin Doppler study. Because the discordance between the twins is already greater than 20% this will      not be used as a criterion for delivery.  - Consider steroids at 30-[redacted] weeks gestation if the patient continues to do well prior to that time.  - He concurs that these studies can be performed in our office and we can consult our local maternal fetal medicine specialist as needed.  The mother reports that the babies are very active today.  Ultrasound in our office today:  Twin A: Breech, normal fluid, 27 weeks and 2 days (70th percentile), 2 lbs. 7 oz., umbilical vein varix seen today that equals 1.2 cm, no thrombosis noted on color Doppler.  Twin B: Breech, normal fluid (fluid pocket measures 3.0 cm), 25 weeks and 5 days (4.5 percentile), 1 lb. 12 oz. (discordance 27%), marginal versus velamentous insertion of the umbilical cord.  We will check to see if Doppler studies were done on twin B.  If the Doppler studies are normal then the patient will return for amniotic fluid assessment on weekly basis and for evaluation of the varix from twin A.  If Doppler studies were not done today on twin B,  then the patient will return for this evaluation.  Repeat growth studies will be performed in 2 weeks.  2. The patient's blood type is O-.  She received RhoGAM for amnio reduction.  We will check an antibody screen and the patient will receive RhoGAM if appropriate.  3. Glucola screen next visit.  Also hemoglobin and RPR.  4. The patient has had a prior cesarean section and at this point she wishes to have a vaginal birth if it is safe and appropriate for her twins.  She is not opposed to a cesarean section if appropriate.  Dr. Stefano Gaul

## 2011-11-07 NOTE — Progress Notes (Signed)
Pt states she has no concerns today.  

## 2011-11-09 ENCOUNTER — Encounter: Payer: Private Health Insurance - Indemnity | Admitting: Obstetrics and Gynecology

## 2011-11-09 ENCOUNTER — Other Ambulatory Visit: Payer: Private Health Insurance - Indemnity

## 2011-11-09 ENCOUNTER — Other Ambulatory Visit (INDEPENDENT_AMBULATORY_CARE_PROVIDER_SITE_OTHER): Payer: Private Health Insurance - Indemnity

## 2011-11-09 ENCOUNTER — Other Ambulatory Visit: Payer: Self-pay | Admitting: Obstetrics and Gynecology

## 2011-11-09 DIAGNOSIS — O30009 Twin pregnancy, unspecified number of placenta and unspecified number of amniotic sacs, unspecified trimester: Secondary | ICD-10-CM

## 2011-11-09 DIAGNOSIS — O409XX0 Polyhydramnios, unspecified trimester, not applicable or unspecified: Secondary | ICD-10-CM

## 2011-11-13 ENCOUNTER — Inpatient Hospital Stay (HOSPITAL_COMMUNITY)
Admission: AD | Admit: 2011-11-13 | Discharge: 2011-11-13 | Disposition: A | Discharge status: Home / Self Care | Payer: Managed Care, Other (non HMO) | Source: Ambulatory Visit | Attending: Obstetrics and Gynecology | Admitting: Obstetrics and Gynecology

## 2011-11-13 DIAGNOSIS — Z2989 Encounter for other specified prophylactic measures: Secondary | ICD-10-CM | POA: Insufficient documentation

## 2011-11-13 DIAGNOSIS — Z298 Encounter for other specified prophylactic measures: Secondary | ICD-10-CM | POA: Insufficient documentation

## 2011-11-13 MED ORDER — RHO D IMMUNE GLOBULIN 1500 UNIT/2ML IJ SOLN
300.0000 ug | Freq: Once | INTRAMUSCULAR | Status: AC
  Administered 2011-11-13: 300 ug via INTRAMUSCULAR
  Filled 2011-11-13: qty 2

## 2011-11-13 NOTE — MAU Note (Signed)
No adverse reaction to rhophylac 

## 2011-11-13 NOTE — MAU Note (Signed)
Pt given rhophylac information.

## 2011-11-14 ENCOUNTER — Encounter: Payer: Private Health Insurance - Indemnity | Admitting: Obstetrics and Gynecology

## 2011-11-14 LAB — RH IG WORKUP (INCLUDES ABO/RH): DAT, IgG: NEGATIVE

## 2011-11-16 ENCOUNTER — Ambulatory Visit (INDEPENDENT_AMBULATORY_CARE_PROVIDER_SITE_OTHER): Payer: Private Health Insurance - Indemnity

## 2011-11-16 ENCOUNTER — Other Ambulatory Visit: Payer: Self-pay | Admitting: Obstetrics and Gynecology

## 2011-11-16 ENCOUNTER — Encounter: Payer: Self-pay | Admitting: Obstetrics and Gynecology

## 2011-11-16 ENCOUNTER — Ambulatory Visit (INDEPENDENT_AMBULATORY_CARE_PROVIDER_SITE_OTHER): Payer: Private Health Insurance - Indemnity | Admitting: Obstetrics and Gynecology

## 2011-11-16 VITALS — BP 118/70 | Wt 207.0 lb

## 2011-11-16 DIAGNOSIS — Z331 Pregnant state, incidental: Secondary | ICD-10-CM

## 2011-11-16 DIAGNOSIS — O26849 Uterine size-date discrepancy, unspecified trimester: Secondary | ICD-10-CM

## 2011-11-16 DIAGNOSIS — O26899 Other specified pregnancy related conditions, unspecified trimester: Secondary | ICD-10-CM

## 2011-11-16 DIAGNOSIS — O30009 Twin pregnancy, unspecified number of placenta and unspecified number of amniotic sacs, unspecified trimester: Secondary | ICD-10-CM

## 2011-11-16 DIAGNOSIS — O409XX0 Polyhydramnios, unspecified trimester, not applicable or unspecified: Secondary | ICD-10-CM

## 2011-11-16 DIAGNOSIS — IMO0002 Reserved for concepts with insufficient information to code with codable children: Secondary | ICD-10-CM

## 2011-11-16 DIAGNOSIS — Z6791 Unspecified blood type, Rh negative: Secondary | ICD-10-CM

## 2011-11-16 DIAGNOSIS — O360990 Maternal care for other rhesus isoimmunization, unspecified trimester, not applicable or unspecified: Secondary | ICD-10-CM

## 2011-11-16 LAB — US OB DETAIL + 14 WK

## 2011-11-16 NOTE — Progress Notes (Signed)
No complaints. Glucola today.  Received RhoGam for second time on 11/13/11 Recommendations from Dr. Lynnell Chad (MFM from [facility] in [place])  Weekly AFI. Prepare patient for delivery if the smaller infantt shows no bladder on ultrasound or a fluid pocket of less than 2 cm   Growth ultrasounds every 2 weeks. If the estimated fetal weight is less than 10th percentile, begin Doppler study. Because the discordance between the twins is already greater than 20% this will not be used as a criterion for delivery. AEDF or RF would lead to consideration of delivery.  Consider steroids at 30-[redacted] weeks gestation if the patient continues to do well prior to that time. Ultrasound: Twin A:  Breech.  AF pocket 6.1 cm    Doppler: greater than 5th %ile .     No AEDF or RF               Twin B: Fluid pocket 4.8 cm     Doppler Nl     No AEDF or RF U/S for growth, AFI,bladder evaluation and dopplers NV Begin Betamethasone series NV.  Will try to send script to pharmacy and have pt bring with her for administration

## 2011-11-21 ENCOUNTER — Other Ambulatory Visit: Payer: Self-pay

## 2011-11-21 DIAGNOSIS — O99810 Abnormal glucose complicating pregnancy: Secondary | ICD-10-CM

## 2011-11-22 ENCOUNTER — Other Ambulatory Visit: Payer: Self-pay | Admitting: Obstetrics and Gynecology

## 2011-11-22 DIAGNOSIS — O409XX0 Polyhydramnios, unspecified trimester, not applicable or unspecified: Secondary | ICD-10-CM

## 2011-11-22 LAB — US OB LIMITED

## 2011-11-24 ENCOUNTER — Encounter: Payer: Self-pay | Admitting: Obstetrics and Gynecology

## 2011-11-24 ENCOUNTER — Ambulatory Visit (INDEPENDENT_AMBULATORY_CARE_PROVIDER_SITE_OTHER): Payer: Private Health Insurance - Indemnity | Admitting: Obstetrics and Gynecology

## 2011-11-24 ENCOUNTER — Ambulatory Visit (INDEPENDENT_AMBULATORY_CARE_PROVIDER_SITE_OTHER): Payer: Private Health Insurance - Indemnity

## 2011-11-24 ENCOUNTER — Other Ambulatory Visit: Payer: Self-pay | Admitting: Obstetrics and Gynecology

## 2011-11-24 VITALS — BP 110/70 | Wt 209.5 lb

## 2011-11-24 DIAGNOSIS — O30009 Twin pregnancy, unspecified number of placenta and unspecified number of amniotic sacs, unspecified trimester: Secondary | ICD-10-CM

## 2011-11-24 DIAGNOSIS — O409XX0 Polyhydramnios, unspecified trimester, not applicable or unspecified: Secondary | ICD-10-CM

## 2011-11-24 NOTE — Patient Instructions (Signed)
Fetal Movement Counts Patient Name: __________________________________________________ Patient Due Date: ____________________ Kick counts is highly recommended in high risk pregnancies, but it is a good idea for every pregnant woman to do. Start counting fetal movements at 28 weeks of the pregnancy. Fetal movements increase after eating a full meal or eating or drinking something sweet (the blood sugar is higher). It is also important to drink plenty of fluids (well hydrated) before doing the count. Lie on your left side because it helps with the circulation or you can sit in a comfortable chair with your arms over your belly (abdomen) with no distractions around you. DOING THE COUNT  Try to do the count the same time of day each time you do it.   Mark the day and time, then see how long it takes for you to feel 10 movements (kicks, flutters, swishes, rolls). You should have at least 10 movements within 2 hours. You will most likely feel 10 movements in much less than 2 hours. If you do not, wait an hour and count again. After a couple of days you will see a pattern.   What you are looking for is a change in the pattern or not enough counts in 2 hours. Is it taking longer in time to reach 10 movements?  SEEK MEDICAL CARE IF:  You feel less than 10 counts in 2 hours. Tried twice.   No movement in one hour.   The pattern is changing or taking longer each day to reach 10 counts in 2 hours.   You feel the baby is not moving as it usually does.  Date: ____________ Movements: ____________ Start time: ____________ Finish time: ____________  Date: ____________ Movements: ____________ Start time: ____________ Finish time: ____________ Date: ____________ Movements: ____________ Start time: ____________ Finish time: ____________ Date: ____________ Movements: ____________ Start time: ____________ Finish time: ____________ Date: ____________ Movements: ____________ Start time: ____________ Finish time:  ____________ Date: ____________ Movements: ____________ Start time: ____________ Finish time: ____________ Date: ____________ Movements: ____________ Start time: ____________ Finish time: ____________ Date: ____________ Movements: ____________ Start time: ____________ Finish time: ____________  Date: ____________ Movements: ____________ Start time: ____________ Finish time: ____________ Date: ____________ Movements: ____________ Start time: ____________ Finish time: ____________ Date: ____________ Movements: ____________ Start time: ____________ Finish time: ____________ Date: ____________ Movements: ____________ Start time: ____________ Finish time: ____________ Date: ____________ Movements: ____________ Start time: ____________ Finish time: ____________ Date: ____________ Movements: ____________ Start time: ____________ Finish time: ____________ Date: ____________ Movements: ____________ Start time: ____________ Finish time: ____________  Date: ____________ Movements: ____________ Start time: ____________ Finish time: ____________ Date: ____________ Movements: ____________ Start time: ____________ Finish time: ____________ Date: ____________ Movements: ____________ Start time: ____________ Finish time: ____________ Date: ____________ Movements: ____________ Start time: ____________ Finish time: ____________ Date: ____________ Movements: ____________ Start time: ____________ Finish time: ____________ Date: ____________ Movements: ____________ Start time: ____________ Finish time: ____________ Date: ____________ Movements: ____________ Start time: ____________ Finish time: ____________  Date: ____________ Movements: ____________ Start time: ____________ Finish time: ____________ Date: ____________ Movements: ____________ Start time: ____________ Finish time: ____________ Date: ____________ Movements: ____________ Start time: ____________ Finish time: ____________ Date: ____________ Movements:  ____________ Start time: ____________ Finish time: ____________ Date: ____________ Movements: ____________ Start time: ____________ Finish time: ____________ Date: ____________ Movements: ____________ Start time: ____________ Finish time: ____________ Date: ____________ Movements: ____________ Start time: ____________ Finish time: ____________  Date: ____________ Movements: ____________ Start time: ____________ Finish time: ____________ Date: ____________ Movements: ____________ Start time: ____________ Finish time: ____________ Date: ____________ Movements: ____________ Start time:   ____________ Finish time: ____________ Date: ____________ Movements: ____________ Start time: ____________ Finish time: ____________ Date: ____________ Movements: ____________ Start time: ____________ Finish time: ____________ Date: ____________ Movements: ____________ Start time: ____________ Finish time: ____________ Date: ____________ Movements: ____________ Start time: ____________ Finish time: ____________  Date: ____________ Movements: ____________ Start time: ____________ Finish time: ____________ Date: ____________ Movements: ____________ Start time: ____________ Finish time: ____________ Date: ____________ Movements: ____________ Start time: ____________ Finish time: ____________ Date: ____________ Movements: ____________ Start time: ____________ Finish time: ____________ Date: ____________ Movements: ____________ Start time: ____________ Finish time: ____________ Date: ____________ Movements: ____________ Start time: ____________ Finish time: ____________ Date: ____________ Movements: ____________ Start time: ____________ Finish time: ____________  Date: ____________ Movements: ____________ Start time: ____________ Finish time: ____________ Date: ____________ Movements: ____________ Start time: ____________ Finish time: ____________ Date: ____________ Movements: ____________ Start time: ____________ Finish  time: ____________ Date: ____________ Movements: ____________ Start time: ____________ Finish time: ____________ Date: ____________ Movements: ____________ Start time: ____________ Finish time: ____________ Date: ____________ Movements: ____________ Start time: ____________ Finish time: ____________ Date: ____________ Movements: ____________ Start time: ____________ Finish time: ____________  Date: ____________ Movements: ____________ Start time: ____________ Finish time: ____________ Date: ____________ Movements: ____________ Start time: ____________ Finish time: ____________ Date: ____________ Movements: ____________ Start time: ____________ Finish time: ____________ Date: ____________ Movements: ____________ Start time: ____________ Finish time: ____________ Date: ____________ Movements: ____________ Start time: ____________ Finish time: ____________ Date: ____________ Movements: ____________ Start time: ____________ Finish time: ____________ Document Released: 06/07/2006 Document Revised: 04/27/2011 Document Reviewed: 12/08/2008 [employer] Patient Information 2012 [employer]. 

## 2011-11-24 NOTE — Progress Notes (Signed)
Pt without c/o Baby A EFW:  1585grams   3-8#,   79.2% AFI:9.1cm pocket Positionbreech Placenta location: posterior, umbilical artery dopplers are WNL Baby B EFW:  1021grams   2-4#,   <3% AFI:4.2cm pocket PositionTVS Placenta location: posteriror, normal doppler with no reverse flow Twin twin transfusion Dopplers and fluid two times a week Beta methasone in one week If fluid pocket <2cm, AEDF or RF recommend delivery [facility] reviewed Pt agrees with the plan

## 2011-11-28 ENCOUNTER — Other Ambulatory Visit: Payer: Self-pay | Admitting: Obstetrics and Gynecology

## 2011-11-28 ENCOUNTER — Ambulatory Visit (INDEPENDENT_AMBULATORY_CARE_PROVIDER_SITE_OTHER): Payer: Private Health Insurance - Indemnity

## 2011-11-28 DIAGNOSIS — O409XX0 Polyhydramnios, unspecified trimester, not applicable or unspecified: Secondary | ICD-10-CM

## 2011-11-28 LAB — US OB LIMITED

## 2011-11-28 LAB — GLUCOSE TOLERANCE, 3 HOURS: Glucose Tolerance, 2 hour: 132 mg/dL (ref 70–164)

## 2011-11-29 ENCOUNTER — Other Ambulatory Visit: Payer: Self-pay

## 2011-11-29 ENCOUNTER — Telehealth: Payer: Self-pay

## 2011-11-29 NOTE — Telephone Encounter (Signed)
Tc to pt per 3hour GTT results=wnl. Pt will go to MAU on 11/30/11 and 12/01/11 for

## 2011-11-29 NOTE — Telephone Encounter (Signed)
11/29/11 phone call cont: Betamethasone 12.5 IM injection on each day. Pt voices understanding.

## 2011-11-30 ENCOUNTER — Inpatient Hospital Stay (HOSPITAL_COMMUNITY)
Admission: AD | Admit: 2011-11-30 | Discharge: 2011-11-30 | Disposition: A | Discharge status: Home / Self Care | Payer: Private Health Insurance - Indemnity | Source: Ambulatory Visit | Attending: Obstetrics and Gynecology | Admitting: Obstetrics and Gynecology

## 2011-11-30 DIAGNOSIS — O4700 False labor before 37 completed weeks of gestation, unspecified trimester: Secondary | ICD-10-CM | POA: Insufficient documentation

## 2011-11-30 LAB — US OB FOLLOW UP

## 2011-11-30 MED ORDER — BETAMETHASONE SOD PHOS & ACET 6 (3-3) MG/ML IJ SUSP
12.0000 mg | Freq: Once | INTRAMUSCULAR | Status: AC
  Administered 2011-11-30: 12 mg via INTRAMUSCULAR
  Filled 2011-11-30: qty 2

## 2011-11-30 NOTE — MAU Note (Signed)
Here for first betamethasone injection.  Allergies confirmed. Process explained.

## 2011-12-01 ENCOUNTER — Ambulatory Visit (INDEPENDENT_AMBULATORY_CARE_PROVIDER_SITE_OTHER): Payer: Private Health Insurance - Indemnity | Admitting: Obstetrics and Gynecology

## 2011-12-01 ENCOUNTER — Ambulatory Visit (HOSPITAL_COMMUNITY)
Admit: 2011-12-01 | Discharge: 2011-12-01 | Disposition: A | Discharge status: Home / Self Care | Payer: Private Health Insurance - Indemnity | Attending: Obstetrics and Gynecology | Admitting: Obstetrics and Gynecology

## 2011-12-01 ENCOUNTER — Encounter (HOSPITAL_COMMUNITY): Payer: Self-pay | Admitting: *Deleted

## 2011-12-01 ENCOUNTER — Other Ambulatory Visit: Payer: Private Health Insurance - Indemnity

## 2011-12-01 ENCOUNTER — Inpatient Hospital Stay (INDEPENDENT_AMBULATORY_CARE_PROVIDER_SITE_OTHER): Payer: Private Health Insurance - Indemnity

## 2011-12-01 ENCOUNTER — Encounter: Payer: Private Health Insurance - Indemnity | Admitting: Obstetrics and Gynecology

## 2011-12-01 ENCOUNTER — Inpatient Hospital Stay (HOSPITAL_COMMUNITY)
Admission: AD | Admit: 2011-12-01 | Discharge: 2011-12-01 | Disposition: A | Discharge status: Home / Self Care | Payer: Private Health Insurance - Indemnity | Source: Ambulatory Visit | Attending: Obstetrics and Gynecology | Admitting: Obstetrics and Gynecology

## 2011-12-01 VITALS — BP 112/71 | HR 89 | Wt 204.0 lb

## 2011-12-01 VITALS — BP 100/64 | Wt 211.0 lb

## 2011-12-01 DIAGNOSIS — O30009 Twin pregnancy, unspecified number of placenta and unspecified number of amniotic sacs, unspecified trimester: Secondary | ICD-10-CM

## 2011-12-01 DIAGNOSIS — O4700 False labor before 37 completed weeks of gestation, unspecified trimester: Secondary | ICD-10-CM | POA: Insufficient documentation

## 2011-12-01 DIAGNOSIS — Z3490 Encounter for supervision of normal pregnancy, unspecified, unspecified trimester: Secondary | ICD-10-CM

## 2011-12-01 DIAGNOSIS — IMO0001 Reserved for inherently not codable concepts without codable children: Secondary | ICD-10-CM

## 2011-12-01 DIAGNOSIS — IMO0002 Reserved for concepts with insufficient information to code with codable children: Secondary | ICD-10-CM

## 2011-12-01 DIAGNOSIS — O368290 Fetal anemia and thrombocytopenia, unspecified trimester, not applicable or unspecified: Secondary | ICD-10-CM

## 2011-12-01 DIAGNOSIS — Z331 Pregnant state, incidental: Secondary | ICD-10-CM

## 2011-12-01 DIAGNOSIS — O409XX0 Polyhydramnios, unspecified trimester, not applicable or unspecified: Secondary | ICD-10-CM

## 2011-12-01 MED ORDER — BETAMETHASONE SOD PHOS & ACET 6 (3-3) MG/ML IJ SUSP
12.0000 mg | Freq: Once | INTRAMUSCULAR | Status: AC
  Administered 2011-12-01: 12 mg via INTRAMUSCULAR
  Filled 2011-12-01: qty 2

## 2011-12-01 NOTE — Progress Notes (Signed)
History   Sent from the office by Dr. 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 Denies srom, vag bleeding, uc, with +FM. No chief complaint on file.  @SFHPI @  OB History    Grav Para Term Preterm Abortions TAB SAB Ect Mult Living   5 1 1  3  1   1     BP 125/67  Pulse 81  Resp 18  LMP 05/03/2011 Medication List  As of 12/01/2011  2:16 PM   TAKE these medications         calcium carbonate 500 MG chewable tablet   Commonly known as: TUMS - dosed in mg elemental calcium   Chew 1 tablet by mouth daily.      prenatal multivitamin Tabs   Take 1 tablet by mouth daily.      VITAMIN D (ERGOCALCIFEROL) PO   Take by mouth.           History reviewed. No pertinent past medical history.  Past Surgical History  Procedure Date  . Cesarean section   . Dilation and curettage of uterus     Family History  Problem Relation Age of Onset  . Asthma Mother   . Diabetes Mother   . Seizures Brother   . Colon cancer Maternal Grandmother   . Cancer Maternal Grandfather     leukemia  . Hypertension Paternal Grandmother   . Parkinsonism Paternal Grandfather   . Cancer Paternal Grandfather     prostate    History  Substance Use Topics  . Smoking status: Never Smoker   . Smokeless tobacco: Never Used  . Alcohol Use: Not on file    Allergies:  Allergies  Allergen Reactions  . Codeine Nausea And Vomiting    Prescriptions prior to admission  Medication Sig Dispense Refill  . calcium carbonate (TUMS - DOSED IN MG ELEMENTAL CALCIUM) 500 MG chewable tablet Chew 1 tablet by mouth daily.      . Prenatal Vit-Fe Fumarate-FA (PRENATAL MULTIVITAMIN) TABS Take 1 tablet by mouth daily.      Marland Kitchen VITAMIN D, ERGOCALCIFEROL, PO Take by mouth.        @ROS @ Physical Exam  Calm, no distress, HEENT WNL, lungs clear bilaterally, AP RRR, abd soft nt,no masses, not tympanic bowel sounds active, abdomen nontender, No edema to lower extremities Blood pressure  130/81, pulse 88, resp. rate 18, last menstrual period 05/03/2011. Fhts category 1 x 2 uc without Vag not assessed A 30 27 week twin P had 2nd dose betamethasone today. [place] BPP dopplers with MFM consult, collaboration with Dr. Estanislado Pandy at [facility],  discharge. Lavera Guise, CNM

## 2011-12-01 NOTE — Progress Notes (Signed)
Spoke with Dr Estanislado Pandy in regards to pts status. FHR tracings. Orders received

## 2011-12-01 NOTE — MAU Note (Signed)
Sent over from physicians office for repeat betamethasone and monitoring

## 2011-12-01 NOTE — Progress Notes (Signed)
No complaints Ultrasound today cervix 0.3 and plan a normal fluid twin B normal fluid twin A breech twin B vertex and a normal umbilical artery Doppler twin B elevated umbilical artery Dopplers is no evidence of reversed or absent flow I reviewed the ultrasound findings with the patient. I recommend steroids.  She actually received her first dose yesterday. I discussed recommendation to proceed to the hospital for second dose of betamethasone as well as fetal monitoring and repeat ultrasound for Dopplers and biophysical profile with MFM. Patient is agreeable and questions answered. I informed the midwife on call, MK, and the physician on call, SR.

## 2011-12-01 NOTE — MAU Note (Signed)
I ambulated pt to MFM for appointment at 230. Discharge instructions reviewed with patient and husband and they both verbalize understanding.

## 2011-12-01 NOTE — Progress Notes (Signed)
Maternal Fetal Medicine Consultation  Requesting Provider(s): Dr. Estanislado Pandy  Reason for consultation: TTTS with elevated umbilical artery Doppler studies  HPI: Julie [name] is a 41 yo [id number] who is seen in consultation due to TTTS with elevated umbilical artery Doppler studies in the office earlier today. Ms. Copus was recently released from care from the [facility] at [facility] due to TTTS.  She was not felt to be a candidate for selective photocoagulation but underwent amnioreduction x1 with resolution of the amniotic fluid discordance.  The patient completed a course of betamethasone earlier this week.  Twin B (the smaller twin) was noted to have elevated umbilical artery Doppler studies in the office today and is referred for further evaluation and management recommendations.  She reports that both twins are active.  She denies contractions/ vaginal bleeding or leakage of fluid.  OB History: OB History    Grav Para Term Preterm Abortions TAB SAB Ect Mult Living   5 1 1  3  1   1     Previous term C/section for arrest of dilation  PSH: Past Surgical History  Procedure Date  . Cesarean section   . Dilation and curettage of uterus    PMH: No past medical history on file.  Meds: PNV, Vitamin D, Tums  Allergies: Codeine  FH: neg for birth defects or hereditary disorders  Soc: Non drinker, non smoker, denies illicit drug use  Review of Systems: no vaginal bleeding or cramping/contractions, no LOF, no nausea/vomiting. All other systems reviewed and are negative.   PE:  VS: BP 112/71                   pulse : 89                 Weight: 204# GEN: well-appearing female ABD: gravid, NT  Please see separate document for fetal ultrasound report.  MC/DA twin gestation with best dates of 30 2/7 weeks.  Twin A: Maternal right, breech presentation, posterior placenta.  The fetus is active with a BPP of 8/8.  The bladder is clearly visualized.  Max verticle pocket of 4.5 cm is  noted. EFW 1787 g (76th %tile) Normal umbilical artery Doppler studies  Twin B: Maternal left, variable presentation, posterior placenta.  A marginal vs. velamentous placental cord insertion is noted.  The bladder is clearly visualized.  Max verticle pocket of 3.8 cm noted.   EFW 984 g (<10th % tile) Elevated umbilical artery Dopplers noted.  No evidence of absent end diastolic flow or reversed flow.   Ductus venosus Dopplers - no reversed A waves noted The fetus is active with a BPP of 8/8.  A/P:  1) MC/DA twins at 30 2/7 weeks 2) TTTS s/p amnioreduction x 2  Concur with betamethsone for fetal lung maturity. Currently, there is no indication for delivery.  In the absence of polyhyramnios / oligohydramnios sequence - no benefit from amnioreduction presently. Will begin 2x weekly Doppler studies and BPPs in this office- to follow up Monday (48 hours) for repeat umbilical artery Dopplers.  Would have low threshold for hospital admission for any concerns on fetal testing.  Would recommend delivery for absent end diastolic flow / reversed flow on umbilical artery Dopplers or any evidence of worsening TTTS. If otherwise stable/ ressuring - recommend delivery by 34 weeks  Plan reviewed and discussed with Dr. Estanislado Pandy.  Thank you for the opportunity to be a part of the care of Julie [name] . Please contact  our office if we can be of further assistance.   [name], MD  I spent approximately 30 minutes with this patient with over 50% of time spent in face-to-face counseling.

## 2011-12-02 ENCOUNTER — Other Ambulatory Visit (HOSPITAL_COMMUNITY): Payer: Self-pay | Admitting: Maternal and Fetal Medicine

## 2011-12-02 DIAGNOSIS — IMO0002 Reserved for concepts with insufficient information to code with codable children: Secondary | ICD-10-CM

## 2011-12-04 ENCOUNTER — Encounter (HOSPITAL_COMMUNITY): Payer: Self-pay | Admitting: Anesthesiology

## 2011-12-04 ENCOUNTER — Ambulatory Visit (HOSPITAL_COMMUNITY)
Admission: RE | Admit: 2011-12-04 | Discharge: 2011-12-04 | Disposition: A | Discharge status: Home / Self Care | Payer: Managed Care, Other (non HMO) | Source: Ambulatory Visit | Attending: Obstetrics and Gynecology | Admitting: Obstetrics and Gynecology

## 2011-12-04 ENCOUNTER — Encounter (HOSPITAL_COMMUNITY): Payer: Self-pay

## 2011-12-04 ENCOUNTER — Inpatient Hospital Stay (HOSPITAL_COMMUNITY): Payer: Managed Care, Other (non HMO) | Admitting: Anesthesiology

## 2011-12-04 ENCOUNTER — Inpatient Hospital Stay (HOSPITAL_COMMUNITY)
Admission: AD | Admit: 2011-12-04 | Discharge: 2011-12-07 | DRG: 765 | Disposition: A | Discharge status: Home / Self Care | Payer: Managed Care, Other (non HMO) | Source: Ambulatory Visit | Attending: Obstetrics and Gynecology | Admitting: Obstetrics and Gynecology

## 2011-12-04 ENCOUNTER — Encounter (HOSPITAL_COMMUNITY)
Admission: AD | Disposition: A | Discharge status: Home / Self Care | Payer: Self-pay | Source: Ambulatory Visit | Attending: Obstetrics and Gynecology

## 2011-12-04 VITALS — BP 113/65 | HR 97 | Wt 214.0 lb

## 2011-12-04 DIAGNOSIS — O0900 Supervision of pregnancy with history of infertility, unspecified trimester: Secondary | ICD-10-CM | POA: Insufficient documentation

## 2011-12-04 DIAGNOSIS — Z6791 Unspecified blood type, Rh negative: Secondary | ICD-10-CM

## 2011-12-04 DIAGNOSIS — O368990 Maternal care for other specified fetal problems, unspecified trimester, not applicable or unspecified: Secondary | ICD-10-CM

## 2011-12-04 DIAGNOSIS — A6000 Herpesviral infection of urogenital system, unspecified: Secondary | ICD-10-CM

## 2011-12-04 DIAGNOSIS — O26899 Other specified pregnancy related conditions, unspecified trimester: Secondary | ICD-10-CM

## 2011-12-04 DIAGNOSIS — O30009 Twin pregnancy, unspecified number of placenta and unspecified number of amniotic sacs, unspecified trimester: Secondary | ICD-10-CM

## 2011-12-04 DIAGNOSIS — O365990 Maternal care for other known or suspected poor fetal growth, unspecified trimester, not applicable or unspecified: Principal | ICD-10-CM | POA: Diagnosis present

## 2011-12-04 DIAGNOSIS — Z87898 Personal history of other specified conditions: Secondary | ICD-10-CM | POA: Insufficient documentation

## 2011-12-04 DIAGNOSIS — O99810 Abnormal glucose complicating pregnancy: Secondary | ICD-10-CM

## 2011-12-04 DIAGNOSIS — O09529 Supervision of elderly multigravida, unspecified trimester: Secondary | ICD-10-CM

## 2011-12-04 DIAGNOSIS — IMO0002 Reserved for concepts with insufficient information to code with codable children: Secondary | ICD-10-CM

## 2011-12-04 DIAGNOSIS — O2620 Pregnancy care for patient with recurrent pregnancy loss, unspecified trimester: Secondary | ICD-10-CM

## 2011-12-04 DIAGNOSIS — Z98891 History of uterine scar from previous surgery: Secondary | ICD-10-CM

## 2011-12-04 DIAGNOSIS — O34219 Maternal care for unspecified type scar from previous cesarean delivery: Secondary | ICD-10-CM | POA: Diagnosis present

## 2011-12-04 DIAGNOSIS — O30039 Twin pregnancy, monochorionic/diamniotic, unspecified trimester: Secondary | ICD-10-CM

## 2011-12-04 DIAGNOSIS — Z8619 Personal history of other infectious and parasitic diseases: Secondary | ICD-10-CM

## 2011-12-04 HISTORY — DX: Abnormal glucose complicating pregnancy: O99.810

## 2011-12-04 HISTORY — DX: Supervision of pregnancy with history of infertility, unspecified trimester: O09.00

## 2011-12-04 HISTORY — DX: Personal history of other specified conditions: Z87.898

## 2011-12-04 HISTORY — DX: Pregnancy care for patient with recurrent pregnancy loss, unspecified trimester: O26.20

## 2011-12-04 HISTORY — DX: Personal history of other infectious and parasitic diseases: Z86.19

## 2011-12-04 LAB — TYPE AND SCREEN
ABO/RH(D): O NEG
DAT, IgG: NEGATIVE

## 2011-12-04 LAB — CBC
Hemoglobin: 10.7 g/dL — ABNORMAL LOW (ref 12.0–15.0)
MCH: 30 pg (ref 26.0–34.0)
MCHC: 32.9 g/dL (ref 30.0–36.0)
MCV: 91 fL (ref 78.0–100.0)
Platelets: 235 10*3/uL (ref 150–400)

## 2011-12-04 SURGERY — Surgical Case
Anesthesia: Spinal | Site: Abdomen | Wound class: Clean Contaminated

## 2011-12-04 MED ORDER — NALBUPHINE HCL 10 MG/ML IJ SOLN
5.0000 mg | INTRAMUSCULAR | Status: DC | PRN
  Filled 2011-12-04: qty 1

## 2011-12-04 MED ORDER — DIPHENHYDRAMINE HCL 50 MG/ML IJ SOLN: 12.5000 mg | INTRAMUSCULAR | Status: DC | PRN

## 2011-12-04 MED ORDER — SCOPOLAMINE 1 MG/3DAYS TD PT72
1.0000 | MEDICATED_PATCH | Freq: Once | TRANSDERMAL | Status: DC
  Administered 2011-12-04: 1.5 mg via TRANSDERMAL

## 2011-12-04 MED ORDER — ONDANSETRON HCL 4 MG/2ML IJ SOLN
4.0000 mg | INTRAMUSCULAR | Status: DC | PRN
  Administered 2011-12-04: 4 mg via INTRAVENOUS

## 2011-12-04 MED ORDER — LACTATED RINGERS IV SOLN: INTRAVENOUS | Status: DC

## 2011-12-04 MED ORDER — OXYTOCIN 40 UNITS IN LACTATED RINGERS INFUSION - SIMPLE MED: 62.5000 mL/h | Freq: Once | INTRAVENOUS | Status: DC

## 2011-12-04 MED ORDER — OXYCODONE-ACETAMINOPHEN 5-325 MG PO TABS: 1.0000 | ORAL_TABLET | ORAL | Status: DC | PRN

## 2011-12-04 MED ORDER — ONDANSETRON HCL 4 MG/2ML IJ SOLN: 4.0000 mg | Freq: Four times a day (QID) | INTRAMUSCULAR | Status: DC | PRN

## 2011-12-04 MED ORDER — FENTANYL CITRATE 0.05 MG/ML IJ SOLN
INTRAMUSCULAR | Status: AC
  Filled 2011-12-04: qty 2

## 2011-12-04 MED ORDER — DIPHENHYDRAMINE HCL 50 MG/ML IJ SOLN: 25.0000 mg | INTRAMUSCULAR | Status: DC | PRN

## 2011-12-04 MED ORDER — PRENATAL MULTIVITAMIN CH
1.0000 | ORAL_TABLET | Freq: Every day | ORAL | Status: DC
  Administered 2011-12-05 – 2011-12-07 (×3): 1 via ORAL
  Filled 2011-12-04 (×3): qty 1

## 2011-12-04 MED ORDER — WITCH HAZEL-GLYCERIN EX PADS: 1.0000 "application " | MEDICATED_PAD | CUTANEOUS | Status: DC | PRN

## 2011-12-04 MED ORDER — LIDOCAINE HCL (PF) 1 % IJ SOLN: 30.0000 mL | INTRAMUSCULAR | Status: DC | PRN

## 2011-12-04 MED ORDER — ONDANSETRON HCL 4 MG/2ML IJ SOLN
4.0000 mg | Freq: Three times a day (TID) | INTRAMUSCULAR | Status: DC | PRN
  Filled 2011-12-04: qty 2

## 2011-12-04 MED ORDER — HYDROXYZINE HCL 50 MG PO TABS: 50.0000 mg | ORAL_TABLET | Freq: Four times a day (QID) | ORAL | Status: DC | PRN

## 2011-12-04 MED ORDER — SODIUM CHLORIDE 0.9 % IV SOLN: 1.0000 ug/kg/h | INTRAVENOUS | Status: DC | PRN

## 2011-12-04 MED ORDER — ONDANSETRON HCL 4 MG/2ML IJ SOLN
INTRAMUSCULAR | Status: DC | PRN
  Administered 2011-12-04: 4 mg via INTRAVENOUS

## 2011-12-04 MED ORDER — IBUPROFEN 600 MG PO TABS
600.0000 mg | ORAL_TABLET | Freq: Four times a day (QID) | ORAL | Status: DC
  Administered 2011-12-05 (×2): 600 mg via ORAL
  Filled 2011-12-04 (×2): qty 1

## 2011-12-04 MED ORDER — ONDANSETRON HCL 4 MG/2ML IJ SOLN
INTRAMUSCULAR | Status: AC
  Filled 2011-12-04: qty 2

## 2011-12-04 MED ORDER — ZOLPIDEM TARTRATE 5 MG PO TABS: 5.0000 mg | ORAL_TABLET | Freq: Every evening | ORAL | Status: DC | PRN

## 2011-12-04 MED ORDER — HYDROXYZINE HCL 50 MG/ML IM SOLN: 50.0000 mg | Freq: Four times a day (QID) | INTRAMUSCULAR | Status: DC | PRN

## 2011-12-04 MED ORDER — LACTATED RINGERS IV SOLN
INTRAVENOUS | Status: DC
  Administered 2011-12-04 (×2): via INTRAVENOUS

## 2011-12-04 MED ORDER — DIPHENHYDRAMINE HCL 25 MG PO CAPS: 25.0000 mg | ORAL_CAPSULE | Freq: Four times a day (QID) | ORAL | Status: DC | PRN

## 2011-12-04 MED ORDER — IBUPROFEN 600 MG PO TABS: 600.0000 mg | ORAL_TABLET | Freq: Four times a day (QID) | ORAL | Status: DC | PRN

## 2011-12-04 MED ORDER — METOCLOPRAMIDE HCL 5 MG/ML IJ SOLN: 10.0000 mg | Freq: Three times a day (TID) | INTRAMUSCULAR | Status: DC | PRN

## 2011-12-04 MED ORDER — KETOROLAC TROMETHAMINE 60 MG/2ML IM SOLN
INTRAMUSCULAR | Status: AC
  Administered 2011-12-04: 60 mg via INTRAMUSCULAR
  Filled 2011-12-04: qty 2

## 2011-12-04 MED ORDER — LANOLIN HYDROUS EX OINT: 1.0000 "application " | TOPICAL_OINTMENT | CUTANEOUS | Status: DC | PRN

## 2011-12-04 MED ORDER — OXYTOCIN BOLUS FROM INFUSION
250.0000 mL | Freq: Once | INTRAVENOUS | Status: DC
  Filled 2011-12-04: qty 500

## 2011-12-04 MED ORDER — SODIUM CHLORIDE 0.9 % IJ SOLN: 3.0000 mL | INTRAMUSCULAR | Status: DC | PRN

## 2011-12-04 MED ORDER — FENTANYL CITRATE 0.05 MG/ML IJ SOLN: 25.0000 ug | INTRAMUSCULAR | Status: DC | PRN

## 2011-12-04 MED ORDER — DIBUCAINE 1 % RE OINT: 1.0000 "application " | TOPICAL_OINTMENT | RECTAL | Status: DC | PRN

## 2011-12-04 MED ORDER — LACTATED RINGERS IV SOLN
INTRAVENOUS | Status: DC | PRN
  Administered 2011-12-04: 19:00:00 via INTRAVENOUS

## 2011-12-04 MED ORDER — NALOXONE HCL 0.4 MG/ML IJ SOLN: 0.4000 mg | INTRAMUSCULAR | Status: DC | PRN

## 2011-12-04 MED ORDER — KETOROLAC TROMETHAMINE 30 MG/ML IJ SOLN: 30.0000 mg | Freq: Four times a day (QID) | INTRAMUSCULAR | Status: AC | PRN

## 2011-12-04 MED ORDER — OXYTOCIN 40 UNITS IN LACTATED RINGERS INFUSION - SIMPLE MED: 62.5000 mL/h | INTRAVENOUS | Status: AC

## 2011-12-04 MED ORDER — MORPHINE SULFATE (PF) 0.5 MG/ML IJ SOLN
INTRAMUSCULAR | Status: DC | PRN
  Administered 2011-12-04: .15 ug via INTRATHECAL

## 2011-12-04 MED ORDER — SCOPOLAMINE 1 MG/3DAYS TD PT72
MEDICATED_PATCH | TRANSDERMAL | Status: AC
  Administered 2011-12-04: 1.5 mg via TRANSDERMAL
  Filled 2011-12-04: qty 1

## 2011-12-04 MED ORDER — CITRIC ACID-SODIUM CITRATE 334-500 MG/5ML PO SOLN
30.0000 mL | ORAL | Status: DC | PRN
  Administered 2011-12-04: 30 mL via ORAL
  Filled 2011-12-04: qty 15

## 2011-12-04 MED ORDER — MORPHINE SULFATE 0.5 MG/ML IJ SOLN
INTRAMUSCULAR | Status: AC
  Filled 2011-12-04: qty 10

## 2011-12-04 MED ORDER — OXYTOCIN 10 UNIT/ML IJ SOLN
40.0000 [IU] | INTRAVENOUS | Status: DC | PRN
  Administered 2011-12-04: 40 [IU] via INTRAVENOUS

## 2011-12-04 MED ORDER — KETOROLAC TROMETHAMINE 30 MG/ML IJ SOLN
30.0000 mg | Freq: Four times a day (QID) | INTRAMUSCULAR | Status: AC | PRN
  Filled 2011-12-04: qty 1

## 2011-12-04 MED ORDER — FENTANYL CITRATE 0.05 MG/ML IJ SOLN
INTRAMUSCULAR | Status: DC | PRN
  Administered 2011-12-04: 25 ug via INTRATHECAL

## 2011-12-04 MED ORDER — BUPIVACAINE HCL (PF) 0.25 % IJ SOLN
INTRAMUSCULAR | Status: AC
  Filled 2011-12-04: qty 30

## 2011-12-04 MED ORDER — DIPHENHYDRAMINE HCL 25 MG PO CAPS: 25.0000 mg | ORAL_CAPSULE | ORAL | Status: DC | PRN

## 2011-12-04 MED ORDER — HYDROMORPHONE HCL PF 1 MG/ML IJ SOLN: 0.2500 mg | INTRAMUSCULAR | Status: DC | PRN

## 2011-12-04 MED ORDER — ONDANSETRON HCL 4 MG PO TABS: 4.0000 mg | ORAL_TABLET | ORAL | Status: DC | PRN

## 2011-12-04 MED ORDER — LACTATED RINGERS IV SOLN: 500.0000 mL | INTRAVENOUS | Status: DC | PRN

## 2011-12-04 MED ORDER — CEFAZOLIN SODIUM-DEXTROSE 2-3 GM-% IV SOLR
2.0000 g | Freq: Once | INTRAVENOUS | Status: AC
  Administered 2011-12-04: 2 g via INTRAVENOUS
  Filled 2011-12-04: qty 50

## 2011-12-04 MED ORDER — BUPIVACAINE HCL (PF) 0.25 % IJ SOLN
INTRAMUSCULAR | Status: DC | PRN
  Administered 2011-12-04: 10 mL

## 2011-12-04 MED ORDER — MENTHOL 3 MG MT LOZG: 1.0000 | LOZENGE | OROMUCOSAL | Status: DC | PRN

## 2011-12-04 MED ORDER — ACETAMINOPHEN 325 MG PO TABS: 650.0000 mg | ORAL_TABLET | ORAL | Status: DC | PRN

## 2011-12-04 MED ORDER — OXYTOCIN 10 UNIT/ML IJ SOLN
INTRAMUSCULAR | Status: AC
  Filled 2011-12-04: qty 4

## 2011-12-04 MED ORDER — SENNOSIDES-DOCUSATE SODIUM 8.6-50 MG PO TABS
2.0000 | ORAL_TABLET | Freq: Every day | ORAL | Status: DC
  Administered 2011-12-05 – 2011-12-06 (×3): 2 via ORAL

## 2011-12-04 MED ORDER — TETANUS-DIPHTH-ACELL PERTUSSIS 5-2.5-18.5 LF-MCG/0.5 IM SUSP
0.5000 mL | Freq: Once | INTRAMUSCULAR | Status: AC
  Administered 2011-12-07: 0.5 mL via INTRAMUSCULAR
  Filled 2011-12-04: qty 0.5

## 2011-12-04 MED ORDER — KETOROLAC TROMETHAMINE 60 MG/2ML IM SOLN
60.0000 mg | Freq: Once | INTRAMUSCULAR | Status: AC | PRN
  Administered 2011-12-04: 60 mg via INTRAMUSCULAR

## 2011-12-04 MED ORDER — PHENYLEPHRINE HCL 10 MG/ML IJ SOLN
INTRAMUSCULAR | Status: DC | PRN
  Administered 2011-12-04 (×2): 80 ug via INTRAVENOUS

## 2011-12-04 MED ORDER — PHENYLEPHRINE 40 MCG/ML (10ML) SYRINGE FOR IV PUSH (FOR BLOOD PRESSURE SUPPORT)
PREFILLED_SYRINGE | INTRAVENOUS | Status: AC
  Filled 2011-12-04: qty 5

## 2011-12-04 MED ORDER — SIMETHICONE 80 MG PO CHEW
80.0000 mg | CHEWABLE_TABLET | Freq: Three times a day (TID) | ORAL | Status: DC
  Administered 2011-12-05 – 2011-12-06 (×4): 80 mg via ORAL

## 2011-12-04 MED ORDER — SIMETHICONE 80 MG PO CHEW
80.0000 mg | CHEWABLE_TABLET | ORAL | Status: DC | PRN
  Administered 2011-12-05: 80 mg via ORAL

## 2011-12-04 MED ORDER — FLEET ENEMA 7-19 GM/118ML RE ENEM: 1.0000 | ENEMA | RECTAL | Status: DC | PRN

## 2011-12-04 MED ORDER — MEPERIDINE HCL 25 MG/ML IJ SOLN: 6.2500 mg | INTRAMUSCULAR | Status: DC | PRN

## 2011-12-04 SURGICAL SUPPLY — 43 items
BENZOIN TINCTURE PRP APPL 2/3 (GAUZE/BANDAGES/DRESSINGS) IMPLANT
BLADE EXTENDED COATED 6.5IN (ELECTRODE) IMPLANT
BLADE HEX COATED 2.75 (ELECTRODE) IMPLANT
BOOTIES KNEE HIGH SLOAN (MISCELLANEOUS) ×4 IMPLANT
CHLORAPREP W/TINT 26ML (MISCELLANEOUS) ×2 IMPLANT
CLOTH BEACON ORANGE TIMEOUT ST (SAFETY) ×2 IMPLANT
CONTAINER PREFILL 10% NBF 15ML (MISCELLANEOUS) ×4 IMPLANT
DERMABOND ADVANCED (GAUZE/BANDAGES/DRESSINGS)
DERMABOND ADVANCED .7 DNX12 (GAUZE/BANDAGES/DRESSINGS) IMPLANT
DRAIN JACKSON PRT FLT 7MM (DRAIN) IMPLANT
DRESSING TELFA 8X3 (GAUZE/BANDAGES/DRESSINGS) IMPLANT
ELECT REM PT RETURN 9FT ADLT (ELECTROSURGICAL) ×2
ELECTRODE REM PT RTRN 9FT ADLT (ELECTROSURGICAL) ×1 IMPLANT
EVACUATOR SILICONE 100CC (DRAIN) IMPLANT
EXTRACTOR VACUUM KIWI (MISCELLANEOUS) IMPLANT
EXTRACTOR VACUUM M CUP 4 TUBE (SUCTIONS) IMPLANT
GAUZE SPONGE 4X4 12PLY STRL LF (GAUZE/BANDAGES/DRESSINGS) IMPLANT
GLOVE SURG SS PI 6.5 STRL IVOR (GLOVE) ×4 IMPLANT
GOWN PREVENTION PLUS LG XLONG (DISPOSABLE) ×6 IMPLANT
KIT ABG SYR 3ML LUER SLIP (SYRINGE) IMPLANT
NEEDLE HYPO 25X5/8 SAFETYGLIDE (NEEDLE) ×2 IMPLANT
NEEDLE SPNL 22GX3.5 QUINCKE BK (NEEDLE) ×2 IMPLANT
NS IRRIG 1000ML POUR BTL (IV SOLUTION) ×2 IMPLANT
PACK C SECTION WH (CUSTOM PROCEDURE TRAY) ×2 IMPLANT
PAD ABD 7.5X8 STRL (GAUZE/BANDAGES/DRESSINGS) ×2 IMPLANT
SLEEVE SCD COMPRESS KNEE MED (MISCELLANEOUS) ×2 IMPLANT
STRIP CLOSURE SKIN 1/4X4 (GAUZE/BANDAGES/DRESSINGS) IMPLANT
SUT CHROMIC 2 0 SH (SUTURE) ×2 IMPLANT
SUT MNCRL AB 3-0 PS2 27 (SUTURE) ×2 IMPLANT
SUT SILK 0 FSL (SUTURE) IMPLANT
SUT VIC AB 0 CT1 27 (SUTURE) ×2
SUT VIC AB 0 CT1 27XBRD ANBCTR (SUTURE) ×2 IMPLANT
SUT VIC AB 0 CT1 36 (SUTURE) IMPLANT
SUT VIC AB 0 CTXB 36 (SUTURE) IMPLANT
SUT VIC AB 2-0 CT1 27 (SUTURE) ×2
SUT VIC AB 2-0 CT1 TAPERPNT 27 (SUTURE) ×2 IMPLANT
SUT VIC AB 2-0 SH 27 (SUTURE)
SUT VIC AB 2-0 SH 27XBRD (SUTURE) IMPLANT
SYR CONTROL 10ML LL (SYRINGE) ×2 IMPLANT
TAPE CLOTH SURG 4X10 WHT LF (GAUZE/BANDAGES/DRESSINGS) ×2 IMPLANT
TOWEL OR 17X24 6PK STRL BLUE (TOWEL DISPOSABLE) ×4 IMPLANT
TRAY FOLEY CATH 14FR (SET/KITS/TRAYS/PACK) ×2 IMPLANT
WATER STERILE IRR 1000ML POUR (IV SOLUTION) IMPLANT

## 2011-12-04 NOTE — Op Note (Addendum)
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 The risks, benefits, complications, treatment options, and expected outcomes were discussed with the patient.  The patient concurred with the proposed plan, giving informed consent.  The site of surgery properly noted/marked. The patient was taken to Operating Room # 9, identified as Cephus Slater and the procedure verified as C-Section Delivery. A Time Out was held and the above information confirmed.  After induction of anesthesia, the patient"s abdomen was  prepped withChloraPrep in the usual sterile manner.A foley catheter was placed under sterile conditions, after prepping the perineum with Betadine.  The patient was then draped in the usual fashion.   Suprapubicsubcutaneous injection of 0.25% Bupivacaine   A Pfannenstiel incision was made and carried down through the subcutaneous tissue to the fascia. Fascial  incision was made and extended transversely. The fascia was separated from the underlying rectus tissue superiorly and inferiorly. The peritoneum was identified and entered. Peritoneal incision was extended longitudinally.  The bladder blade  was placed.   A low transverse uterine incision was made two cm above the uterovesical fold, and that incision extended transversely bluntly.baby A. "Julie [name]",  was  delivered from the footling breech presentation was a female with Apgar scores of 7 at one minute and 8 at five minutes. Baby B, "Julie [name]", was delivered from the occiput posterior position after rupture of her membranous sac.  Cord blood was obtained for evaluation from each umbilical cord appearing. The placenta was removed intact, and sent for pathologic evaluation. The uterine outline, tubes and ovaries appeared norma for the gravid state. The uterine incision was closed with running locked sutures of 0 Vicryl. An imbricating layer of sutures was placed. Hemostasis was achieved with several figure-of-eight hemostatic sutures along the uterine suture line.. Lavage was carried out until clear. The peritoneum was closed with a running suture of 2-0 Vicryl.  The rectus muscles were reapproximated with a figure of 8 suture of 2-0 Vicryl.  The fascia was then reapproximated with a running sutures of 0 Vicryl .Renforcing figure of 8 sutures of 0Vicryl were placed on either side of midline.   The skin was reapproximated with 3-0 moncryl.  A sterile dressing was applied.  Instrument, sponge, and needle counts were correct prior to the abdominal closure and at the conclusion of the case.   Findings:  Placentas were fused. The umbilical cord for both babies contain a three-vessel cord. The umbilical cord for baby B had a velamentous marginal insertion.  That cord was much smaller in diameter than the cord for baby At    Estimated Blood Loss:  1000 cc         Drains: None         Total IV Fluids: 2500 cc           Specimens:  Placenta sent to pathology         Implants: none         Complications: ::"None; patient tolerated the procedure well."         Disposition: PACU - hemodynamically stable.         Condition: stable  Attending Attestation: I performed the procedure.  Onica Davidovich P  12/04/2011 6:41 PM

## 2011-12-04 NOTE — Consult Note (Addendum)
Asked to speak to Julie [name] to prepare her prior to delivery by C/S today due to frequent absent end diastolic flow on Twin B at 30 1/2 weeks. Hx obtained from MFM note, and conversation with OB and RN. Pregnancy is notable for twin gestation by clomid, IUGR of twin B, twin to twin transfusion requiring amnio reduction at 19 weeks. Fluid has been stable since. Mom received 2 doses of  Betamethasone last Th and Fri.  I spoke to Julie [name] and discussed common morbidities associated at this age mainly respiratory and Hct abnormalities. Discussed resuscitation and various respiratory support. Discussed vent and intubation if necessary. Discussed increased neurodevelopmental risk to the SGA baby and greater need to optimize nutrition after birth.  Discussed benefits of breastfeeding to both babies. Questions answered.  Thank you for inviting [place] to meet these parent before the babies are born.  [name] Q  Face to face 20 min.

## 2011-12-04 NOTE — H&P (Signed)
I have had an opportunity to discuss the findings of Dr. Aldine Contes with the parents. The new finding of absence of end-diastolic flow in twin B.is considered deterioration of the condition of this smaller twin who has been known to have intrauterine growth restriction. It was not initially clear whether all of this growth restriction was do to twin-twin transfusion syndrome or whether some was do to the unusual insertion of the cord. In light of evidence of this deterioration it was thought that it would be unlikely that the patient would remain stable in this date for long enough to give her any additional significant benefit from prolongation of her pregnancy and death the risk of fetal compromise was too great. I have thus recommended that we proceed with cesarean section for delivery of the twins . The patient had a high-protein drink at 11 AM and had had a regular breakfast at approximately 9 AM. Consultation with anesthesia set the most appropriate time for cesarean section at approximately 6 hours after the high protein liquid.  The risks of anesthesia bleeding infection and damage to adjacent organs were all reviewed. The patient was familiar with this discussion from her previous cesarean section. I did draw the distinction however of the increased risk of bleeding with a twin pregnancy and will plan to type and screen the patient to insure the availability of blood fairly rapidly. The parents acknowledged having all of her questions answered and wished to proceed with repeat cesarean section

## 2011-12-04 NOTE — Progress Notes (Signed)
Patient seen for follow up BPP and Doppler studies.  See report in [facility].  [name], MD  Julie [name] was recently released from care from the [facility] at [facility] due to TTTS.  She was not felt to be a candidate for selective photocoagulation but underwent amnioreduction x1 with resolution of the amniotic fluid discordance.  The patient completed a course of betamethasone last week.  Twin B (the smaller twin) noted to have elevated umbilical artery Dopplers with frequent episodes absent end-diastolic flow which is a change over the last 48 hrs.  Both fetuses are active with BPPs of 8/8.  MC/DA twin gestation with best dates of 56 5/7 weeks  Twin A: Maternal right, breech presentation, posterior placenta.  The fetus is active with a BPP of 8/8.  The bladder is clearly visualized.  Max verticle pocket of 5.1 cm is note Normal umbilical artery Doppler studies  Twin B: Maternal left, variable presentation, posterior placenta.  A marginal vs. velamentous placental cord insertion is noted.  The bladder is clearly visualized.  Max verticle pocket of 5.7 cm noted.   Elevated umbilical artery Dopplers noted with frequent episodes of absent end-diastolic flow; no evidence of reversed flow Ductus venosus Dopplers - no reversed A waves noted The fetus is active with a BPP of 8/8.  Findings were reviewed with Dr. Pennie Rushing.  Based on the new findings of frequent episodes of absent end-diastolic flow on umbilical artery Doppler studies on Twin B, recommend delivery.

## 2011-12-04 NOTE — Anesthesia Procedure Notes (Signed)

## 2011-12-04 NOTE — Transfer of Care (Signed)
Immediate Anesthesia Transfer of Care Note  Patient: Julie [name]  Procedure(s) Performed: Procedure(s) (LRB): CESAREAN SECTION (N/A)  Patient Location: PACU  Anesthesia Type: Spinal  Level of Consciousness: awake  Airway & Oxygen Therapy: Patient Spontanous Breathing  Post-op Assessment: Report given to PACU RN  Post vital signs: Reviewed and stable  Complications: No apparent anesthesia complications

## 2011-12-04 NOTE — Anesthesia Preprocedure Evaluation (Signed)
Anesthesia Evaluation  Patient identified by MRN, date of birth, ID band Patient awake    Reviewed: Allergy & Precautions, H&P , Patient's Chart, lab work & pertinent test results  Airway Mallampati: II TM Distance: >3 FB Neck ROM: full    Dental No notable dental hx.    Pulmonary  breath sounds clear to auscultation  Pulmonary exam normal       Cardiovascular Exercise Tolerance: Good Rhythm:regular Rate:Normal     Neuro/Psych    GI/Hepatic   Endo/Other  Morbid obesity  Renal/GU      Musculoskeletal   Abdominal   Peds  Hematology   Anesthesia Other Findings 18 Ga IV T&C x2 Hemabate/Methergine  to OR  Reproductive/Obstetrics                           Anesthesia Physical Anesthesia Plan  ASA: III  Anesthesia Plan: Spinal   Post-op Pain Management:    Induction:   Airway Management Planned:   Additional Equipment:   Intra-op Plan:   Post-operative Plan:   Informed Consent: I have reviewed the patients History and Physical, chart, labs and discussed the procedure including the risks, benefits and alternatives for the proposed anesthesia with the patient or authorized representative who has indicated his/her understanding and acceptance.   Dental Advisory Given  Plan Discussed with: CRNA  Anesthesia Plan Comments: (Lab work confirmed with CRNA in room. Platelets okay. Discussed spinal anesthetic, and patient consents to the procedure:  included risk of possible headache,backache, failed block, allergic reaction, and nerve injury. This patient was asked if she had any questions or concerns before the procedure started. )        Anesthesia Quick Evaluation

## 2011-12-04 NOTE — H&P (Signed)
Julie [name] is a 41 y.o.MWF female presenting at [redacted]w[redacted]d for admission from MFM clinic.  Pt at f/u u/s this morning and twin B noted to now have frequent episodes of absent end diastolic flow. Pt denies any recent illness, fever, or chills.  No resp or GI c/o's. No UTI or PIH s/s.  Denies VB or LOF. GFM x2.   Prenatal course:  Pt had early u/s at [redacted]w[redacted]d confirming [facility] and twin gestation (mono/di).  NOB wu/ at [redacted]w[redacted]d.  Pt had normal 1st trimester screening and AFP.  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 Lynnell Chad (MFM in [place]).  Discordant growth and fluid volumes noted early on, and pt did receive amnio-reduction x1 on 09/20/11 by dr. Lynnell Chad in [place], and since that procedure, AFI's have been concordant, and laser ablation was not rec'd following that. Pt treated for conjunctivitis and pharyngitis with antibiotics in Feb. Elevated 1hr gtt, but nml 3hr gtt. Discordant growth has persisted, w/ serial growth monitoring biweekly, but on 7/11, elevated Umb artery doppler for Twin B noted.  BPP's have been normal.  Pt received BMZ course 11/30/11 and 12/01/11, and u/s repeated by MFM (Dr. Claudean Severance) on 7/12 w/ same findings. Twin A on maternal rt, breech presentation and growth 76%.  Twin B on maternal left, with variable presentation, and also has a marginal vs velamentous cord insertion, and on 7/12, EFW <10%.  Pt has received Rhophylac x2 during this pregnancy.  .. Patient Active Problem List  Diagnosis  . H/O cesarean section  . Rh negative status during pregnancy  . Genital HSV 1  . Twin pregnancy  . AMA (advanced maternal age) multigravida 35+  . 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 Conjunctivitis of left eye  . Lymphadenopathy, submandibular  . Acute pharyngitis  . Sore throat  . Abnormal glucose tolerance test in pregnancy, antepartum  . Clomid pregnancy  .  Hx of abnormal Pap smear  . History of chlamydia infection  . History of recurrent abortion, currently pregnant   OB Hx: G1=2008-IUFD at 13 weeks w/ D&C G2=2009-SAB around 6 weeks G3=2009-possible chemical pregnancy G4=8/'10-c/s for FTP at 40+ weeks; Female "Max"=8lb 4oz G5=current pregnancy (clomid)  Maternal Medical History:  Fetal activity: Perceived fetal activity is normal.   Last perceived fetal movement was within the past hour.      OB History    Grav Para Term Preterm Abortions TAB SAB Ect Mult Living   5 1 1  3  1   1      Past Medical History  Diagnosis Date  . Abnormal glucose tolerance test in pregnancy, antepartum 12/04/2011    1hr gtt=140 Nml 3hr gtt on 11/27/11=87, 144, 132, 120  . Clomid pregnancy 12/04/2011    1 cycle of clomid before conception (50mg  per pt)   Past Surgical History  Procedure Date  . Cesarean section   . Dilation and curettage of uterus    Family History: family history includes Asthma in her mother; Cancer in her maternal grandfather and paternal grandfather; Colon cancer in her maternal grandmother; Diabetes in her mother; Hypertension in her paternal grandmother; Parkinsonism in her paternal grandfather; and Seizures in her brother. Social History:  reports that she has never smoked. She has never used smokeless tobacco. 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  Husband "Jonny Ruiz" is involved & supportive.    Review of Systems  Constitutional: Negative.   HENT: Negative.   Eyes: Negative.   Respiratory: Negative.   Cardiovascular: Negative.   Gastrointestinal: Negative.   Skin: Negative.   Neurological: Negative.       Blood pressure 126/86, pulse 91, temperature 98.2 F (36.8 C), temperature source Oral, height 5\' 2"  (1.575 m), weight 214 lb (97.07 kg), last menstrual period 05/03/2011. Maternal Exam:  Uterine Assessment: None discernable  Abdomen:  Patient reports no abdominal tenderness. Surgical scars: low transverse.   Estimated fetal weight is from 12/01/11:  A=1787g & B=984g.   Fetal presentation: breech  Introitus: not evaluated.   Cervix: not evaluated.   Fetal Exam Fetal Monitor Review: Mode: ultrasound.   Baseline rate: A-140 & B-150.  Variability: moderate (6-25 bpm).   Pattern: accelerations present and no decelerations.    Fetal State Assessment: Category I - tracings are normal.     Physical Exam  Constitutional: She is oriented to person, place, and time. She appears well-developed and well-nourished. No distress.       Very pleasant  HENT:  Head: Normocephalic and atraumatic.  Eyes: Pupils are equal, round, and reactive to light.  Neck: Normal range of motion.  Cardiovascular: Normal rate.   Respiratory: Effort normal.  GI: Soft.       Gravid; S>D  Genitourinary:       Pelvic exam deferred  Musculoskeletal: She exhibits edema.       1+ BLE pitting edema  Neurological: She is alert and oriented to person, place, and time. She has normal reflexes.  Skin: Skin is warm and dry.  Psychiatric: She has a normal mood and affect. Her behavior is normal. Judgment and thought content normal.    Prenatal labs: ABO, Rh: --/--/O NEG (06/24 1112) Antibody: POS (06/24 1112) Rubella:  immune RPR: NON REAC (06/27 1205)  HBsAg:   negative HIV:   nonreactive GBS:  not done 1st trimester screens and AFP's all negative 1hr gtt=140 3hr gtt on 11/27/11 Normal  Assessment/Plan: 1.  Twins at [redacted]w[redacted]d 2.  TTTS with discordant growth, but concordant fluid s/p amnio-reduction 09/20/11 3.  Baby B w/ frequent absent end diastolic flow on u/s today 4.  S/p BMZ course 11/30/11 & 7/12 5.  Breech presentation and pt w/ previous c/s hx 6.  Rh neg  1.  Admitted to [facility] per c/w dr. Pennie Rushing w/ routine L&D orders 2.  Delivery rec'd by Dr. Claudean Severance following u/s result today 3.  C/s rec'd secondary to breech presentation and prior  c/s 4.  POC disc'd w/ pt and her husband and consent received by Dr. Pennie Rushing to proceed w/ delivery via c/s based on Dr. Fredda Hammed recommendations.  R/b/a discussed, including but not limited to risk of anesthesia complications, bleeding, infection, and damage to surrounding organs. Following discussion, pt verbalized understanding and agreeable with POC. 5.  NICU consult for pt and husband after admission 6.  Prepped for OR and all questions answered.  STEELMAN,CANDICE H 12/04/2011, 3:50 PM

## 2011-12-05 ENCOUNTER — Encounter (HOSPITAL_COMMUNITY): Payer: Self-pay | Admitting: Obstetrics and Gynecology

## 2011-12-05 ENCOUNTER — Other Ambulatory Visit: Payer: Private Health Insurance - Indemnity

## 2011-12-05 DIAGNOSIS — Z98891 History of uterine scar from previous surgery: Secondary | ICD-10-CM | POA: Diagnosis not present

## 2011-12-05 LAB — CBC
HCT: 29.8 % — ABNORMAL LOW (ref 36.0–46.0)
MCHC: 33.2 g/dL (ref 30.0–36.0)
Platelets: 224 10*3/uL (ref 150–400)
RDW: 12.6 % (ref 11.5–15.5)
WBC: 15.6 10*3/uL — ABNORMAL HIGH (ref 4.0–10.5)

## 2011-12-05 LAB — RPR: RPR Ser Ql: NONREACTIVE

## 2011-12-05 MED ORDER — KETOROLAC TROMETHAMINE 10 MG PO TABS
10.0000 mg | ORAL_TABLET | Freq: Four times a day (QID) | ORAL | Status: DC
  Administered 2011-12-05 – 2011-12-07 (×8): 10 mg via ORAL
  Filled 2011-12-05 (×12): qty 1

## 2011-12-05 MED ORDER — RHO D IMMUNE GLOBULIN 1500 UNIT/2ML IJ SOLN
300.0000 ug | Freq: Once | INTRAMUSCULAR | Status: AC
  Administered 2011-12-05: 300 ug via INTRAMUSCULAR
  Filled 2011-12-05: qty 2

## 2011-12-05 MED ORDER — KETOROLAC TROMETHAMINE 30 MG/ML IJ SOLN
30.0000 mg | Freq: Once | INTRAMUSCULAR | Status: AC
  Administered 2011-12-05: 30 mg via INTRAVENOUS

## 2011-12-05 NOTE — Anesthesia Postprocedure Evaluation (Signed)
  Anesthesia Post-op Note  Patient: Julie [name]  Procedure(s) Performed: Procedure(s) (LRB): CESAREAN SECTION (N/A)  Patient Location: Mother/Baby  Anesthesia Type: Spinal  Level of Consciousness: awake, alert  and oriented  Airway and Oxygen Therapy: Patient Spontanous Breathing  Post-op Pain: mild  Post-op Assessment: Post-op Vital signs reviewed, Patient's Cardiovascular Status Stable, No headache, No backache, No residual numbness and No residual motor weakness  Post-op Vital Signs: Reviewed and stable  Complications: No apparent anesthesia complications

## 2011-12-05 NOTE — Progress Notes (Signed)
Subjective: Postpartum Day 1: Cesarean Delivery Patient reports tolerating PO, + flatus and no problems voiding.  Pumping.  While in room, Dr. Mikle Bosworth came by to report Baby A (recipient) has pneumothorax.  Husband at bedside.  Using IS.  Episode of vomiting last night but ok today.  Pt w/ codeine allergy and states percocet in past caused n/v, so d/c'd Motrin earlier today and ordered Toradol po and doing well so far.  VB very light.    Objective: Vital signs in last 24 hours: Temp:  [98 F (36.7 C)-98.8 F (37.1 C)] 98.5 F (36.9 C) (07/16 0930) Pulse Rate:  [64-88] 88  (07/16 0930) Resp:  [16-18] 18  (07/16 0930) BP: (96-143)/(51-81) 104/66 mmHg (07/16 0930) SpO2:  [95 %-100 %] 95 % (07/16 0930) Weight:  [214 lb (97.07 kg)] 214 lb (97.07 kg) (07/16 1604)  Physical Exam:  General: alert, cooperative and no distress Abdomen:  BS+x4; appropriately tender Lochia: appropriate, rubra Uterine Fundus: firm, below umbilicus Incision: post-op dsg c/d/i DVT Evaluation: No evidence of DVT seen on physical exam. Negative Homan's sign. Calf/Ankle edema is present; 1-2+ BLE pitting.    Basename 12/05/11 0605 12/04/11 1445  HGB 9.9* 10.7*  HCT 29.8* 32.5*    Assessment/Plan: Status post repeat Cesarean section, twins at [redacted]w[redacted]d for TTTS. Doing well postoperatively.  Continue current care.  Pumping.  Support given r/e girls in NICU.  Ultram prn additional pain medication.   Dehaven Sine H 12/05/2011, 5:47 PM

## 2011-12-05 NOTE — Progress Notes (Signed)
UR Chart review completed.  

## 2011-12-05 NOTE — Progress Notes (Signed)
12/05/11 1100  Clinical Encounter Type  Visited With Patient and family together (Husband [name])  Visit Type Initial;Spiritual support;Social support  Spiritual Encounters  Spiritual Needs Emotional    Made initial visit with Julie [name] and husband Julie [name] to offer chaplain services throughout her stay and NICU time for [name] and [name].  They report strong family support; her mom has been in from out of state for six weeks already, providing support through medical appointments and now through deliveries, and his parents live in [place].  [name] and Julie [name] are upbeat, excited, pleased with care and detailed information provided, and [name] is feeling a sense of purpose and control from being able to pump for her girls.  Provided pastoral presence and encouragement.  Family is aware of ongoing chaplain availability.  [address] [place], [place] [phone]

## 2011-12-05 NOTE — Clinical Social Work Maternal (Signed)
    Clinical Social Work Department PSYCHOSOCIAL ASSESSMENT - MATERNAL/CHILD 12/05/2011  Patient:  BRITLEE, SKOLNIK  Account Number:  [id number]  Admit Date:  12/04/2011  Marjo Bicker Name:   Durel Salts    Clinical Social Worker:  Lulu Riding, LCSW   Date/Time:  12/05/2011 10:40 AM  Date Referred:  12/05/2011   Referral source  NICU     Referred reason  NICU   Other referral source:    I:  FAMILY / HOME ENVIRONMENT Child's legal guardian:  PARENT  Guardian - Name Guardian - Age Guardian - Address  Nandita Mathenia [address], [place], [place] [address]  [place] Born  same   Other household support members/support persons Name Relationship DOB  Max Balch BROTHER 12/22/08   Other support:   Good support system.  FOB's parents live in [place] and [place] is currently here from [place] for as long as they need her to stay.    II  PSYCHOSOCIAL DATA Information Source:  Family Interview  Surveyor, quantity and [employer] Employment:   FOB works at [employer]. [employer]   Financial resources:  Media planner If [employer] - [place]:    School / Grade:   Maternity Care Coordinator / Child Services Coordination / Early Interventions:  Cultural issues impacting care:   none known    III  STRENGTHS Strengths  Adequate Resources  Compliance with medical plan  Home prepared for Child (including basic supplies)  Other - See comment  Supportive family/friends  Understanding of illness   Strength comment:  Pediatric follow up will be with Dr. Earlene Plater at [facility]   IV  RISK FACTORS AND CURRENT PROBLEMS Current Problem:  None     V  SOCIAL WORK ASSESSMENT SW met with parents in MOB's third floor room/306 to introduce myself, complete assessment and evaluate how they are coping with babies' admissions to NICU.  Parents were very pleasant and welcoming of SW's intervention.  They report having a good support system and all necessary baby supplies at  home.  They seem to have a good understanding of the situation and appear to be coping well.  SW discussed signs and symptoms of PPD and common emotions related to a NICU admission.  Parents were very open to this information.  They report feeling well informed by medical staff at this time.  They have no questions or concerns at this time.  SW explained support services offered by NICU SW and gave contact information.  SW explained the qualifications for SSI and that baby B was very close to meeting these guidelines.  SW states that they can apply and see if she is found to be eligible and SW can assist if they wish.  They have decided to apply.      VI SOCIAL WORK PLAN Social Work Plan  Psychosocial Support/Ongoing Assessment of Needs   Type of pt/family education:   Emotions related to NICU   If child protective services report - county:   If child protective services report - date:   Information/referral to community resources comment:   SSI  Feelings After Birth handout   Other social work plan:

## 2011-12-06 LAB — RH IG WORKUP (INCLUDES ABO/RH)
ABO/RH(D): O NEG
Gestational Age(Wks): 30.5

## 2011-12-06 NOTE — Progress Notes (Signed)
Subjective: Postpartum Day 2: Cesarean Delivery Patient reports tolerating PO, + flatus and no problems voiding.    Objective: Vital signs in last 24 hours: Temp:  [97.5 F (36.4 C)-98.5 F (36.9 C)] 98.5 F (36.9 C) (07/17 1200) Pulse Rate:  [75-91] 91  (07/17 1200) Resp:  [16-17] 16  (07/17 1200) BP: (110-128)/(76-81) 115/80 mmHg (07/17 1200) SpO2:  [95 %-98 %] 96 % (07/17 1200) Weight:  [214 lb (97.07 kg)] 214 lb (97.07 kg) (07/16 1604)  Physical Exam:  General: alert Lochia: appropriate Uterine Fundus: firm Incision: healing well, no significant drainage DVT Evaluation: No evidence of DVT seen on physical exam.   Basename 12/05/11 0605 12/04/11 1445  HGB 9.9* 10.7*  HCT 29.8* 32.5*    Assessment/Plan: Status post Cesarean section. Doing well postoperatively.  Continue current care.  Lois Slagel A 12/06/2011, 1:05 PM

## 2011-12-07 ENCOUNTER — Inpatient Hospital Stay (HOSPITAL_COMMUNITY): Admission: RE | Admit: 2011-12-07 | Payer: Private Health Insurance - Indemnity | Source: Ambulatory Visit

## 2011-12-07 MED ORDER — TRAMADOL HCL 50 MG PO TABS: 100.0000 mg | ORAL_TABLET | Freq: Four times a day (QID) | ORAL | Status: AC | PRN

## 2011-12-07 MED ORDER — IBUPROFEN 200 MG PO TABS: 600.0000 mg | ORAL_TABLET | Freq: Four times a day (QID) | ORAL | Status: AC | PRN

## 2011-12-07 NOTE — Discharge Summary (Signed)
Obstetric Discharge Summary Reason for Admission: [redacted]w[redacted]d, twins, TTTS, non-reassuring fetal testing Prenatal Procedures: amniocentesis, NST and ultrasound Intrapartum Procedures: cesarean: low cervical, transverse Postpartum Procedures: none Complications-Operative and Postpartum: none  Temp:  [97.5 F (36.4 C)-98.4 F (36.9 C)] 97.5 F (36.4 C) (07/18 0627) Pulse Rate:  [75-89] 75  (07/18 0627) Resp:  [18-20] 18  (07/18 0627) BP: (107-115)/(73-78) 109/76 mmHg (07/18 0627) SpO2:  [97 %] 97 % (07/18 0627) Hemoglobin  Date Value Range Status  12/05/2011 9.9* 12.0 - 15.0 g/dL Final     HCT  Date Value Range Status  12/05/2011 29.8* 36.0 - 46.0 % Final    Hospital Course:  Hospital Course: Admitted after US showed absent end diastolic flow on twin B, pt had been followed closely secondary to TTTS and IUGR w mono/di twins. neg GBS.  Delivery was performed by Dr Pennie Rushing without difficulty. Patient  tolerated the procedure without difficulty. Both infants went to NICU. Mother and infant then had an uncomplicated postpartum course, with pumping going well, getting several ounces of milk today. Mom's physical exam was WNL, and she was discharged home in stable condition. Contraception plan was husband to get vasetcomy.  She received adequate benefit from po pain medications, she did receive PO toradol during hospital stay w good relief.   Consults: MFM, NICU, chaplain, social work  Discharge Diagnoses: TTTS w non-reassuring fetal testing at [redacted]w[redacted]d  Discharge Information: Date: 12/07/2011 Activity: pelvic rest Diet: routine and iron rich Medications:  Medication List  As of 12/07/2011  1:32 PM   START taking these medications         ibuprofen 200 MG tablet   Commonly known as: ADVIL,MOTRIN   Take 3 tablets (600 mg total) by mouth every 6 (six) hours as needed for pain.      traMADol 50 MG tablet   Commonly known as: ULTRAM   Take 2 tablets (100 mg total) by mouth every 6 (six) hours  as needed for pain.         CONTINUE taking these medications         calcium carbonate 500 MG chewable tablet   Commonly known as: TUMS - dosed in mg elemental calcium      prenatal multivitamin Tabs      VITAMIN D (ERGOCALCIFEROL) PO          Where to get your medications    These are the prescriptions that you need to pick up. We sent them to a specific pharmacy, so you will need to go there to get them.   [id number] - [place], [place] - [address] AT [facility] OF [address]    [address] [place] [address]    Phone: [phone]        ibuprofen 200 MG tablet   traMADol 50 MG tablet           Condition: stable Instructions: refer to practice specific booklet Discharge to: home Follow-up Information    Follow up with [facility]. Schedule an appointment as soon as possible for a visit in 5 weeks.         Newborn Data: Live born  Information for the patient's newborn:  Pippa, Hanif [[id number]]  female Information for the patient's newborn:  Raelyn, Racette [[id number]]  female Baby A: APGAR ,7, 8  ; weight ; 3#15oz  Baby B: apgar 8, 8 ; wgt 2#11oz  Both infants remain in NICU  Whitnee Orzel  M 12/07/2011, 1:32 PM

## 2011-12-07 NOTE — Progress Notes (Signed)
Discharge instructions reviewed with patient.  Patient able to " Teach Back"  Home care and signs and symptoms to report to MD.  No home equipment needed.  Ambulated to car with staff without incident and discharged to care of husband.

## 2011-12-08 ENCOUNTER — Other Ambulatory Visit: Payer: Private Health Insurance - Indemnity

## 2011-12-08 ENCOUNTER — Encounter: Payer: Private Health Insurance - Indemnity | Admitting: Obstetrics and Gynecology

## 2011-12-08 ENCOUNTER — Telehealth: Payer: Self-pay | Admitting: Obstetrics and Gynecology

## 2011-12-08 NOTE — Telephone Encounter (Signed)
TC from pt, post-op c/s for twins on Monday, c/o nausea from tramadol and phenergan had been called in for her, she had a question about safety w BF, rv'd that it is category L2, and is safe to use. pt states tramadol had helped w pain, can't take codiene products. Offered to switch to toradol instead of a motrin for the next 4-5 days, as that is what she was taking in the hospital. Also called in zofran to use, however I cautioned pt about risk of constipation w zofran. Pt also reports some drainage at incision site, no redness or separation noted. Enc pt to keep clean and dry. Call if sx's worsen. Call if pain not controlled or nausea persists.

## 2011-12-08 NOTE — Telephone Encounter (Signed)
Call RX tramadol to pharmacy as prior documented in meds. Lavera Guise, CNM

## 2011-12-11 ENCOUNTER — Ambulatory Visit (HOSPITAL_COMMUNITY): Payer: Private Health Insurance - Indemnity

## 2011-12-12 ENCOUNTER — Other Ambulatory Visit: Payer: Private Health Insurance - Indemnity

## 2011-12-12 ENCOUNTER — Encounter: Payer: Private Health Insurance - Indemnity | Admitting: Obstetrics and Gynecology

## 2011-12-13 ENCOUNTER — Other Ambulatory Visit: Payer: Private Health Insurance - Indemnity

## 2011-12-14 ENCOUNTER — Ambulatory Visit (HOSPITAL_COMMUNITY): Payer: Private Health Insurance - Indemnity

## 2011-12-15 ENCOUNTER — Encounter: Payer: Private Health Insurance - Indemnity | Admitting: Obstetrics and Gynecology

## 2011-12-15 ENCOUNTER — Other Ambulatory Visit: Payer: Private Health Insurance - Indemnity

## 2011-12-19 ENCOUNTER — Other Ambulatory Visit: Payer: Private Health Insurance - Indemnity

## 2011-12-22 ENCOUNTER — Encounter: Payer: Private Health Insurance - Indemnity | Admitting: Obstetrics and Gynecology

## 2011-12-22 ENCOUNTER — Other Ambulatory Visit: Payer: Private Health Insurance - Indemnity

## 2012-01-08 ENCOUNTER — Other Ambulatory Visit: Payer: Self-pay

## 2012-01-10 ENCOUNTER — Encounter: Payer: Self-pay | Admitting: Obstetrics and Gynecology

## 2012-01-10 ENCOUNTER — Ambulatory Visit (INDEPENDENT_AMBULATORY_CARE_PROVIDER_SITE_OTHER): Payer: Private Health Insurance - Indemnity | Admitting: Obstetrics and Gynecology

## 2012-01-10 NOTE — Progress Notes (Signed)
Subjective:   Post Partum: Twins are in NICU Date of delivery: 12/04/2011 Female TWINS Name: Julie [name] Vaginal delivery:no Cesarean section:yes Tubal ligation:no GDM:no Breast Feeding:no Bottle Feeding:no Post-Partum Blues:yes Abnormal pap:no Normal GU function: yes Normal GI function:yes Last Pap- 06/2011 Returning to work:yes January 2014 EPDS: 7    Julie [name] is a 41 y.o. female who presents for a postpartum visit.  I have fully reviewed the prenatal and intrapartum course. She delivered twins at 31 weeks for twi/twin transfusion syndrome.  They remain in the NICU but are progressing well.   Patient is not sexually active.   The following portions of the patient's history were reviewed and updated as appropriate: allergies, current medications, past family history, past medical history, past social history, past surgical history and problem list.  Review of Systems Pertinent items are noted in HPI.   Objective:    BP 112/64  Ht 5\' 2"  (1.575 m)  Wt 180 lb (81.647 kg)  BMI 32.92 kg/m2  Breastfeeding? No  General:  alert, cooperative and no distress     Lungs: clear to auscultation bilaterally  Heart:  regular rate and rhythm, S1, S2 normal, no murmur  Abdomen: soft, non-tender; bowel sounds normal; no masses,  no organomegaly.  Incision healing well   Vulva:  normal  Vagina: normal vagina  Cervix:  normal  Corpus: normal size, contour, position, consistency, mobility, non-tender  Adnexa:  normal adnexa             Assessment:     Normal postpartum exam.  Pap smear not done at today's visit.   Plan:  Couple plans vasectomy in Dec and will use condoms till then. RTO for aex  Laren Orama MD 01/10/2012 10:50 AM

## 2012-02-07 ENCOUNTER — Encounter: Payer: Self-pay | Admitting: Obstetrics and Gynecology

## 2012-02-27 ENCOUNTER — Other Ambulatory Visit: Payer: Self-pay | Admitting: Obstetrics and Gynecology

## 2012-03-19 ENCOUNTER — Telehealth: Payer: Self-pay | Admitting: Obstetrics and Gynecology

## 2012-03-19 NOTE — Telephone Encounter (Signed)
Pt wants recommendation from [facility] for husband vasectomy

## 2012-03-19 NOTE — Telephone Encounter (Signed)
[facility] pt 

## 2012-03-20 NOTE — Telephone Encounter (Signed)
Lm on vm to cb per telephone call.  

## 2012-03-20 NOTE — Telephone Encounter (Signed)
Tc from pt. Pt informed to call Dr. Madilyn Hook office to sched eval for vasectomy. Number given to pt to [facility]. Pt agrees.

## 2014-03-23 ENCOUNTER — Encounter: Payer: Self-pay | Admitting: Obstetrics and Gynecology

## 2014-04-26 IMAGING — US US OB DETAIL+14 WK
1 series · 13 of 28 positions shown · non-contrast
Comparison: none

[Series 1: us ob detail+14 wk · 0.26mm/px · 13 of 145 slices shown]
[im 6/145]
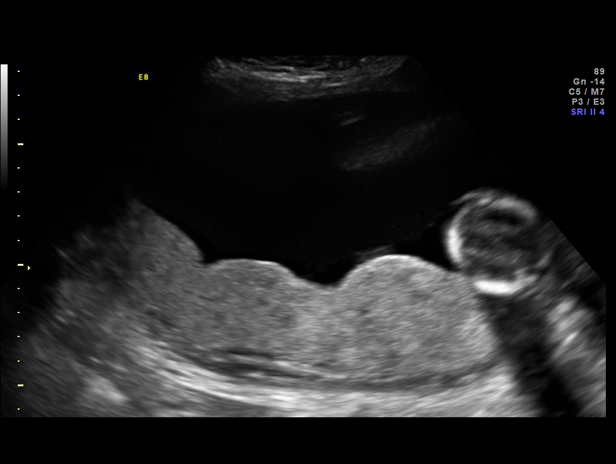
[im 17/145]
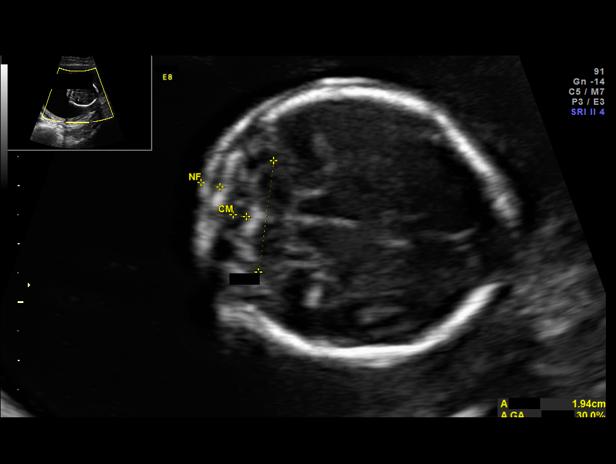
[im 27/145]
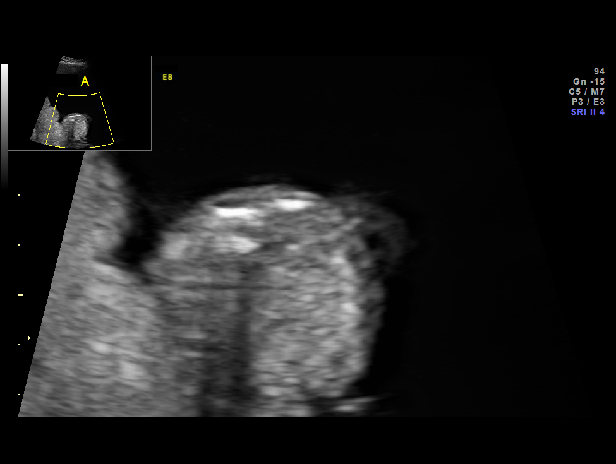
[im 38/145]
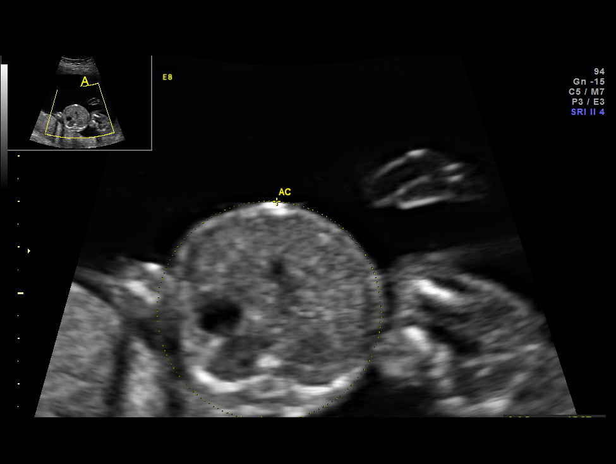
[im 49/145]
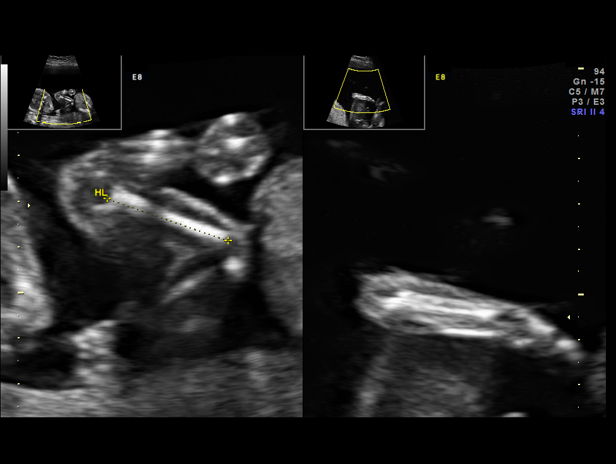
[im 59/145]
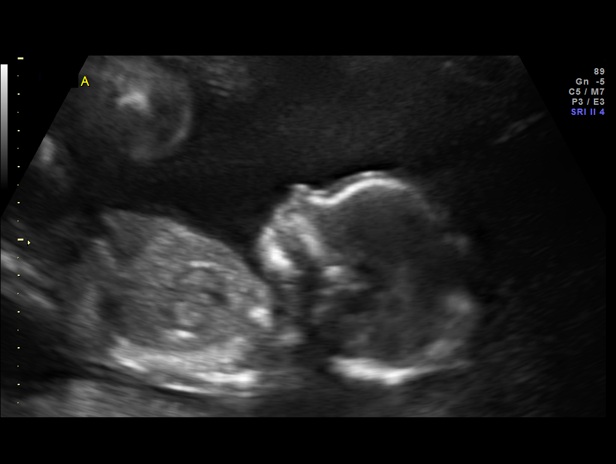
[im 75/145]
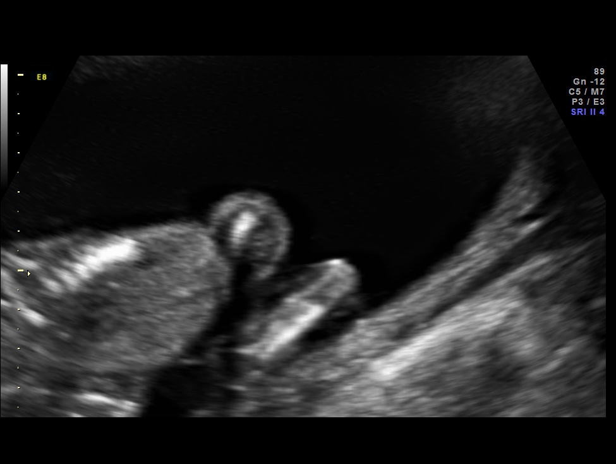
[im 86/145]
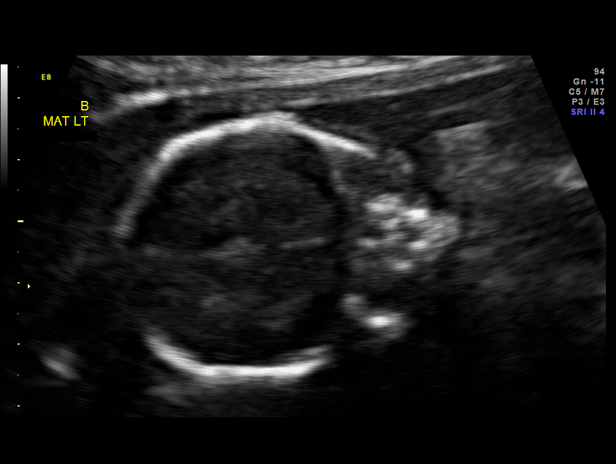
[im 97/145]
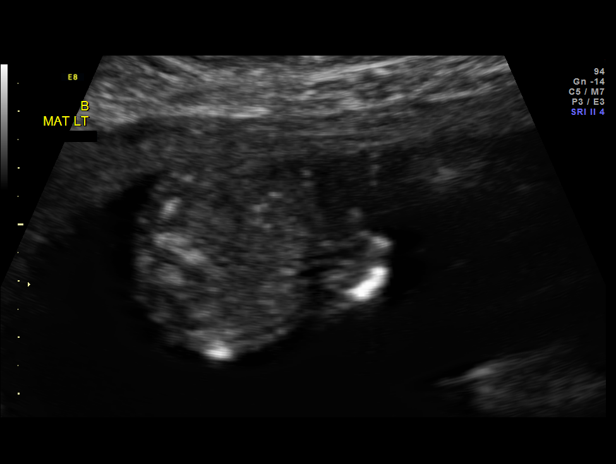
[im 107/145]
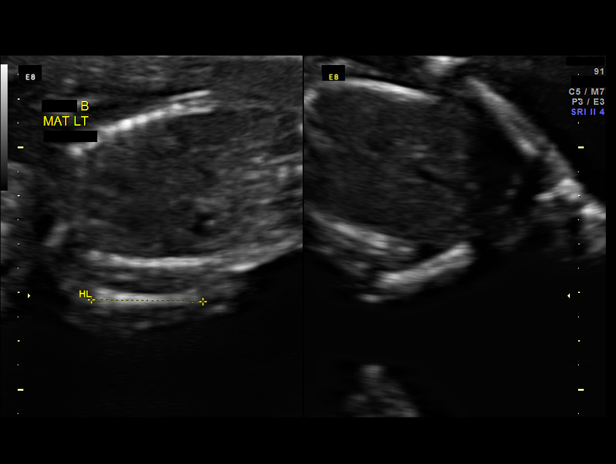
[im 118/145]
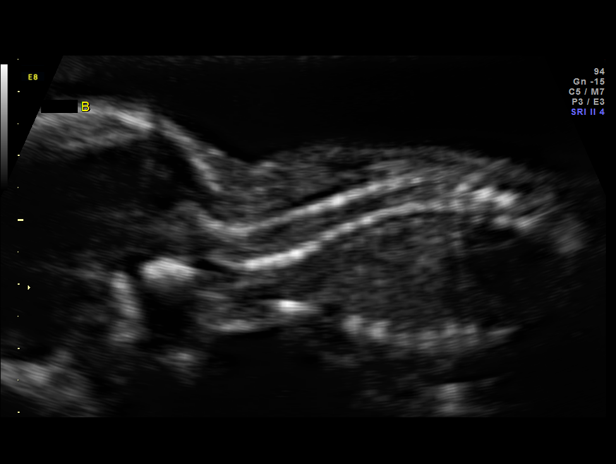
[im 129/145]
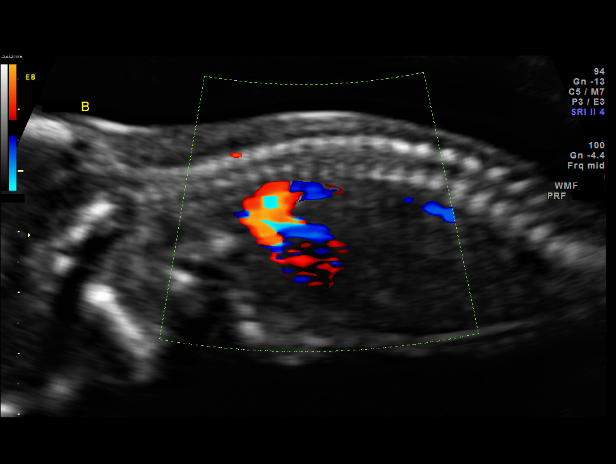
[im 139/145]
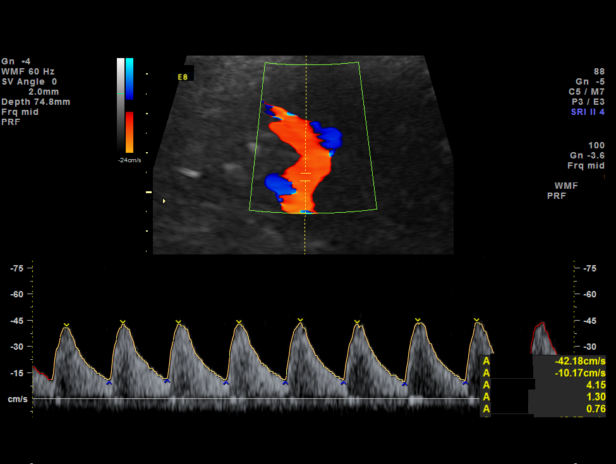

[13 of 28 positions shown; findings below may reference images not displayed]

OBSTETRICS REPORT
                      (Signed Final 09/15/2011 [DATE])

                                                       [[id number]
 Order#:         [id number]
                 [id number]
                 [id number]
Procedures

 US OB DETAIL + 14 WK                                  76811.0
 US OB DETAIL ADD'L GEST + 14 WK                       76811.1
 US UA CORD DOPPLER                                    76827.2
 US UA ADD'L GEST                                      76827.3
Indications

 Detailed fetal anatomic survey
 Twin gestation, [place]
 Discordant Growth
 Poor obstetric history-Recurrent (habitual) abortion
 (3 consecutive ab's)
 Previous cesarean section
 Previous cervical surgery (LEEP)
Fetal Evaluation (Fetus A)

 Fetal Heart Rate:  150                          bpm
 Cardiac Activity:  Observed
 Fetal Lie:         Lower Right Fetus
 Presentation:      Cephalic
 Placenta:          Posterior, above cervical
                    os
 P. Cord            Visualized
 Insertion:

 Membrane Desc:     Dividing
                    Membrane seen
                    - Monochorionic

 Amniotic Fluid
 AFI FV:      Polyhydramnios
                                             Larg Pckt:     9.5  cm
Biometry (Fetus A)

 BPD:     45.1  mm     G. Age:  19w 4d                CI:         81.0   70 - 86
 OFD:     55.7  mm                                    FL/HC:      18.6   16.1 -

 HC:     162.2  mm     G. Age:  19w 0d       29  %    HC/AC:      1.07   1.09 -

 AC:     151.7  mm     G. Age:  20w 3d       79  %    FL/BPD:
 FL:      30.2  mm     G. Age:  19w 3d       45  %    FL/AC:      19.9   20 - 24
 HUM:     27.8  mm     G. Age:  18w 6d       41  %
 CER:     19.4  mm     G. Age:  18w 5d       35  %
 NFT:      3.3  mm

 Est. FW:     314  gm    0 lb 11 oz      54  %     FW Discordancy     0 \ 35 %
Gestational Age (Fetus A)

 LMP:           19w 2d        Date:  05/03/11                 EDD:   02/07/12
 U/S Today:     19w 4d                                        EDD:   02/05/12
 Best:          19w 2d     Det. By:  LMP  (05/03/11)          EDD:   02/07/12
Anatomy (Fetus A)

 Cranium:           Appears normal      Aortic Arch:       Appears normal
 Fetal Cavum:       Appears normal      Ductal Arch:       Appears normal
 Ventricles:        Appears normal      Diaphragm:         Appears normal
 Choroid Plexus:    Appears normal      Stomach:           Appears
                                                           normal, left
                                                           sided
 Cerebellum:        Appears normal      Abdomen:           Appears normal
 Posterior Fossa:   Appears normal      Abdominal Wall:    Appears nml
                                                           (cord insert,
                                                           abd wall)
 Nuchal Fold:       Appears normal      Cord Vessels:      Appears normal
                    (neck, nuchal                          (3 vessel cord)
                    fold)
 Face:              Appears normal      Kidneys:           Appear normal
                    (lips/profile/orbit
                    s)
 Heart:             Appears normal      Bladder:           Appears normal
                    (4 chamber &
                    axis)
 RVOT:              Appears normal      Spine:             Appears normal
 LVOT:              Appears normal      Limbs:             Appears normal
                                                           (hands, ankles,
                                                           feet)

 Other:     Fetus appears to be a female. Heels and 5th digit
            visualized. Nasal bone visualized.
Targeted Anatomy (Fetus A)

 Fetal Central Nervous System
 Cisterna Magna:
Doppler - Fetal Vessels (Fetus A)

 Umbilical Artery
 S/D:   4.36           54  %tile       RI:
 PI:    1.33                           PSV:       67.44   cm/s
 Umbilical Artery
 Absent DFV:    No     Reverse DFV:    No

Fetal Evaluation (Fetus B)
 Fetal Heart Rate:  169                          bpm
 Cardiac Activity:  Observed
 Fetal Lie:         Left Fetus
 Presentation:      Breech
 Placenta:          Posterior, above cervical
                    os
 P. Cord            Marginal insertion
 Insertion:

 Membrane Desc:     Dividing
                    Membrane seen
                    - Monochorionic

 Amniotic Fluid
 AFI FV:      Oligohydramnios
Biometry (Fetus B)

 BPD:     39.4  mm     G. Age:  18w 0d                CI:         74.1   70 - 86
 OFD:     53.2  mm                                    FL/HC:      16.2   16.1 -

 HC:     150.3  mm     G. Age:  18w 1d        5  %    HC/AC:      1.23   1.09 -

 AC:     121.8  mm     G. Age:  17w 6d        9  %    FL/BPD:
 FL:      24.4  mm     G. Age:  17w 3d      < 3  %    FL/AC:      20.0   20 - 24
 HUM:     23.2  mm     G. Age:  17w 1d      < 5  %
 CER:     20.1  mm     G. Age:  19w 1d       47  %
 NFT:      2.6  mm

 Est. FW:     205  gm      0 lb 7 oz   < 25  %     FW Discordancy        35  %
Gestational Age (Fetus B)

 LMP:           19w 2d        Date:  05/03/11                 EDD:   02/07/12
 U/S Today:     17w 6d                                        EDD:   02/17/12
 Best:          19w 2d     Det. By:  LMP  (05/03/11)          EDD:   02/07/12
Anatomy (Fetus B)

 Cranium:           Appears normal      Aortic Arch:       Not well
                                                           visualized
 Fetal Cavum:       Appears normal      Ductal Arch:       Appears normal
 Ventricles:        Appears normal      Diaphragm:         Appears normal
 Choroid Plexus:    Appears normal      Stomach:           Appears normal
 Cerebellum:        Appears normal      Abdomen:           Appears normal
 Posterior Fossa:   Appears normal      Abdominal Wall:    Appears nml
                                                           (cord insert,
                                                           abd wall)
 Nuchal Fold:       Appears normal      Cord Vessels:      Appears normal
                    (neck, nuchal                          (3 vessel cord)
                    fold)
 Face:              Lips and orbits     Kidneys:           Appear normal
                    appear normal
 Heart:             Not well            Bladder:           Appears normal
                    visualized
 RVOT:              Not well            Spine:             Appears normal
                    visualized
 LVOT:              Not well            Limbs:             Appears normal
                    visualized                             (hands, ankles,
                                                           feet)
 Other:     Fetus appears to be a female. Heels and 5th digit
            visualized. Technically difficult due to fetal position.
Targeted Anatomy (Fetus B)

 Fetal Central Nervous System
 Cisterna Magna:
Doppler - Fetal Vessels (Fetus B)

 Umbilical Artery
 S/D:   7.75       > 97.5  %tile       RI:
 PI:    1.76                           PSV:       -       cm/s

 Umbilical Artery
 Absent DFV:    No     Reverse DFV:    No

Cervix Uterus Adnexa

 Cervical Length:    3.6      cm

 Cervix:       Normal appearance by transabdominal scan.
Comments

 Evidence of twin-twin transfusion is seen on today's
 ultrasound.  There is significant growth discordancy and
 severe oligohydramnios of twin B.  In addition, umbilical
 artery dopplers of twin B show and elevated S/D ratio.  Thus,
 the patient has class 3 twin to twin transfusion.  As such she
 is best referred for fetoscopic laser placenta ablation.  We
 discussed the centers in [HOSPITAL] as well as other states that
 perform this procedure.  The patient would like to go to [HOSPITAL]-
 [facility].  We will make arrangements for this referral and
 call the patient back next week.
Impression

 Monochorionic monoamniotic twin intrauterine pregnancy at
 19 weeks 2 days.
 Appropriate fetal growth (54% &  <25%).
 Significant fetal growth discordance (35%).
 Fetal growth restriction of Twin B.
 Polyhdramnios of Twin A.
 Oligohydramnios of Twin B.
 No gross fetal anomalies identified in either twin.
 Elevated umbilical artery S/D ratio in Twin B.
 Stage 3 twin-twin transfusion syndrome.
Recommendations

 Refer to [HOSPITAL] for fetoscopic laser placenta ablation.

                Fancourt, [place]

## 2015-01-15 ENCOUNTER — Other Ambulatory Visit: Payer: Self-pay | Admitting: Obstetrics and Gynecology

## 2015-01-15 DIAGNOSIS — R928 Other abnormal and inconclusive findings on diagnostic imaging of breast: Secondary | ICD-10-CM

## 2015-01-21 ENCOUNTER — Other Ambulatory Visit: Payer: Managed Care, Other (non HMO)

## 2015-01-28 ENCOUNTER — Ambulatory Visit
Admission: RE | Admit: 2015-01-28 | Discharge: 2015-01-28 | Disposition: A | Discharge status: Home / Self Care | Payer: BLUE CROSS/BLUE SHIELD | Source: Ambulatory Visit | Attending: Obstetrics and Gynecology | Admitting: Obstetrics and Gynecology

## 2015-01-28 ENCOUNTER — Ambulatory Visit
Admission: RE | Admit: 2015-01-28 | Discharge: 2015-01-28 | Disposition: A | Discharge status: Home / Self Care | Payer: Managed Care, Other (non HMO) | Source: Ambulatory Visit | Attending: Obstetrics and Gynecology | Admitting: Obstetrics and Gynecology

## 2015-01-28 DIAGNOSIS — R928 Other abnormal and inconclusive findings on diagnostic imaging of breast: Secondary | ICD-10-CM

## 2015-06-23 ENCOUNTER — Other Ambulatory Visit: Payer: Self-pay | Admitting: Obstetrics and Gynecology

## 2015-06-23 DIAGNOSIS — N6489 Other specified disorders of breast: Secondary | ICD-10-CM

## 2015-08-03 ENCOUNTER — Ambulatory Visit
Admission: RE | Admit: 2015-08-03 | Discharge: 2015-08-03 | Disposition: A | Discharge status: Home / Self Care | Payer: BLUE CROSS/BLUE SHIELD | Source: Ambulatory Visit | Attending: Obstetrics and Gynecology | Admitting: Obstetrics and Gynecology

## 2015-08-03 DIAGNOSIS — N6489 Other specified disorders of breast: Secondary | ICD-10-CM

## 2016-01-17 ENCOUNTER — Other Ambulatory Visit: Payer: Self-pay | Admitting: Obstetrics and Gynecology

## 2016-01-17 DIAGNOSIS — N6489 Other specified disorders of breast: Secondary | ICD-10-CM

## 2016-01-27 ENCOUNTER — Other Ambulatory Visit: Payer: BLUE CROSS/BLUE SHIELD

## 2016-02-08 ENCOUNTER — Ambulatory Visit
Admission: RE | Admit: 2016-02-08 | Discharge: 2016-02-08 | Disposition: A | Discharge status: Home / Self Care | Payer: BLUE CROSS/BLUE SHIELD | Source: Ambulatory Visit | Attending: Obstetrics and Gynecology | Admitting: Obstetrics and Gynecology

## 2016-02-08 DIAGNOSIS — N6489 Other specified disorders of breast: Secondary | ICD-10-CM

## 2017-03-29 ENCOUNTER — Other Ambulatory Visit: Payer: Self-pay | Admitting: Obstetrics and Gynecology

## 2017-03-29 DIAGNOSIS — N6489 Other specified disorders of breast: Secondary | ICD-10-CM

## 2017-03-29 DIAGNOSIS — Z139 Encounter for screening, unspecified: Secondary | ICD-10-CM

## 2017-04-03 ENCOUNTER — Ambulatory Visit
Admission: RE | Admit: 2017-04-03 | Discharge: 2017-04-03 | Disposition: A | Discharge status: Home / Self Care | Payer: BLUE CROSS/BLUE SHIELD | Source: Ambulatory Visit | Attending: Obstetrics and Gynecology | Admitting: Obstetrics and Gynecology

## 2017-04-03 DIAGNOSIS — N6489 Other specified disorders of breast: Secondary | ICD-10-CM

## 2021-01-25 ENCOUNTER — Encounter: Payer: Self-pay | Admitting: Gastroenterology

## 2021-02-15 ENCOUNTER — Encounter: Payer: Self-pay | Admitting: Gastroenterology

## 2021-02-15 ENCOUNTER — Other Ambulatory Visit: Payer: Self-pay

## 2021-02-15 ENCOUNTER — Ambulatory Visit (AMBULATORY_SURGERY_CENTER): Payer: BC Managed Care – PPO | Admitting: *Deleted

## 2021-02-15 VITALS — Ht 62.0 in | Wt 202.0 lb

## 2021-02-15 DIAGNOSIS — Z1211 Encounter for screening for malignant neoplasm of colon: Secondary | ICD-10-CM

## 2021-02-15 NOTE — Progress Notes (Signed)
No egg or soy allergy known to patient  issues known to pt with past sedation with any surgeries or procedures of PONV Patient denies ever being told they had issues or difficulty with intubation  No FH of Malignant Hyperthermia Pt is not on diet pills Pt is not on  home 02  Pt is not on blood thinners  Pt denies issues with constipation  No A fib or A flutter  EMMI video to pt or via MyChart   Pt is fully vaccinated  for Covid   Due to the COVID-19 pandemic we are asking patients to follow certain guidelines.  Pt aware of COVID protocols and [facility] guidelines   Pt verified name, DOB, address and insurance during PV today.  Pt mailed instruction packet of Emmi video, copy of consent form to read and not return, and instructions. . PV completed over the phone.  Pt encouraged to call with questions or issues.  My Chart instructions to pt as well

## 2021-03-02 ENCOUNTER — Encounter: Payer: Self-pay | Admitting: Gastroenterology

## 2021-03-04 ENCOUNTER — Encounter: Payer: Self-pay | Admitting: Gastroenterology

## 2021-03-04 ENCOUNTER — Ambulatory Visit (AMBULATORY_SURGERY_CENTER): Payer: BC Managed Care – PPO | Admitting: Gastroenterology

## 2021-03-04 VITALS — BP 126/74 | HR 77 | Temp 98.0°F | Resp 14 | Ht 62.0 in | Wt 199.0 lb

## 2021-03-04 DIAGNOSIS — D120 Benign neoplasm of cecum: Secondary | ICD-10-CM | POA: Diagnosis not present

## 2021-03-04 DIAGNOSIS — Z1211 Encounter for screening for malignant neoplasm of colon: Secondary | ICD-10-CM

## 2021-03-04 MED ORDER — SODIUM CHLORIDE 0.9 % IV SOLN: 500.0000 mL | Freq: Once | INTRAVENOUS | Status: AC

## 2021-03-04 NOTE — Op Note (Signed)
[facility] Patient Name: Julie [name] Procedure Date: 03/04/2021 3:30 PM MRN: [id number] Endoscopist: Thornton Park MD, MD Age: 50 Referring MD:  Date of Birth: 12-12-1970 Gender: Female Account #: [id number] Procedure:                Colonoscopy Indications:              Screening for colorectal malignant neoplasm, This                            is the patient's first colonoscopy Medicines:                Monitored Anesthesia Care Procedure:                Pre-Anesthesia Assessment:                           - Prior to the procedure, a History and Physical                            was performed, and patient medications and                            allergies were reviewed. The patient's tolerance of                            previous anesthesia was also reviewed. The risks                            and benefits of the procedure and the sedation                            options and risks were discussed with the patient.                            All questions were answered, and informed consent                            was obtained. Prior Anticoagulants: The patient has                            taken no previous anticoagulant or antiplatelet                            agents. ASA Grade Assessment: II - A patient with                            mild systemic disease. After reviewing the risks                            and benefits, the patient was deemed in                            satisfactory condition to undergo the procedure.  After obtaining informed consent, the colonoscope                            was passed under direct vision. Throughout the                            procedure, the patient's blood pressure, pulse, and                            oxygen saturations were monitored continuously. The                            CF [id number] #[id number] was introduced through the anus                            and advanced to  the the cecum, identified by                            appendiceal orifice and ileocecal valve. The                            colonoscopy was performed without difficulty. The                            patient tolerated the procedure well. The quality                            of the bowel preparation was good. The ileocecal                            valve, appendiceal orifice, and rectum were                            photographed. Scope In: 3:46:02 PM Scope Out: 3:59:11 PM Scope Withdrawal Time: 0 hours 7 minutes 26 seconds  Total Procedure Duration: 0 hours 13 minutes 9 seconds  Findings:                 The perianal and digital rectal examinations were                            normal.                           Non-bleeding internal hemorrhoids were found.                           A 1 mm polyp was found in the cecum. The polyp was                            sessile. The polyp was removed with a cold snare.                            Resection and retrieval were complete. Estimated  blood loss was minimal.                           The exam was otherwise without abnormality on                            direct and retroflexion views. Complications:            No immediate complications. Estimated blood loss:                            Minimal. Estimated Blood Loss:     Estimated blood loss was minimal. Impression:               - Non-bleeding internal hemorrhoids.                           - One 1 mm polyp in the cecum, removed with a cold                            snare. Resected and retrieved.                           - The examination was otherwise normal on direct                            and retroflexion views. Recommendation:           - Patient has a contact number available for                            emergencies. The signs and symptoms of potential                            delayed complications were discussed with the                             patient. Return to normal activities tomorrow.                            Written discharge instructions were provided to the                            patient.                           - Resume previous diet.                           - Continue present medications.                           - Await pathology results.                           - Repeat colonoscopy date to be determined after  pending pathology results are reviewed for                            surveillance.                           - Emerging evidence supports eating a diet of                            fruits, vegetables, grains, calcium, and yogurt                            while reducing red meat and alcohol may reduce the                            risk of colon cancer.                           - Thank you for allowing me to be involved in your                            colon cancer prevention. Thornton Park MD, MD 03/04/2021 4:08:18 PM This report has been signed electronically.

## 2021-03-04 NOTE — Progress Notes (Signed)
Referring Provider: Thornton Park, MD Primary Care Physician:  Aretta Nip, MD  Reason for Procedure:  Colon cancer screening   IMPRESSION:  Need for colon cancer screening Appropriate candidate for monitored anesthesia care  PLAN: Colonoscopy in the [place] today   HPI: Julie [name] is a 50 y.o. female presents for screening colonoscopy.  No prior colonoscopy or colon cancer screening.  No baseline GI symptoms.   No known family history of colon cancer or polyps. No family history of uterine/endometrial cancer, pancreatic cancer or gastric/stomach cancer.   Past Medical History:  Diagnosis Date   Abnormal glucose tolerance test in pregnancy, antepartum 12/04/2011   1hr gtt=140 Nml 3hr gtt on 11/27/11=87, 144, 132, 120   Allergy    mild  no meds   Clomid pregnancy 12/04/2011   1 cycle of clomid before conception (50mg  per pt)   GERD (gastroesophageal reflux disease)    History of chlamydia infection 12/04/2011   Treated age 89   History of recurrent abortion, currently pregnant 12/04/2011   G1, G2, & G3   Hx of abnormal Pap smear 12/04/2011    Past Surgical History:  Procedure Laterality Date   CESAREAN SECTION  2010   2010   CESAREAN SECTION  12/04/2011   Procedure: CESAREAN SECTION;  Surgeon: Eldred Manges, MD;  Location: [facility];  Service: Gynecology;  Laterality: N/A;   DILATION AND CURETTAGE OF UTERUS     WISDOM TOOTH EXTRACTION      Current Outpatient Medications  Medication Sig Dispense Refill   Multiple Vitamin (MULTIVITAMIN) tablet Take 1 tablet by mouth daily.     calcium carbonate (TUMS - DOSED IN MG ELEMENTAL CALCIUM) 500 MG chewable tablet Chew 1 tablet by mouth daily.     famotidine (PEPCID) 20 MG tablet Take 20 mg by mouth as needed for heartburn or indigestion.     omega-3 fish oil (MAXEPA) 1000 MG CAPS capsule Takes OCC     Current Facility-Administered Medications  Medication Dose Route Frequency Provider Last Rate Last  Admin   0.9 %  sodium chloride infusion  500 mL Intravenous Once Thornton Park, MD        Allergies as of 03/04/2021 - Review Complete 03/04/2021  Allergen Reaction Noted   Codeine Nausea And Vomiting 09/08/2011    Family History  Problem Relation Age of Onset   Colon polyps Mother    Asthma Mother    Diabetes Mother    Seizures Brother    Esophageal cancer Maternal Uncle    Cancer Maternal Grandfather        leukemia   Colon cancer Paternal Grandmother    Hypertension Paternal Grandmother    Parkinsonism Paternal Grandfather    Cancer Paternal Grandfather        prostate   Rectal cancer Neg Hx    Stomach cancer Neg Hx      Physical Exam: General:   Alert,  well-nourished, pleasant and cooperative in NAD Head:  Normocephalic and atraumatic. Eyes:  Sclera clear, no icterus.   Conjunctiva pink. Mouth:  No deformity or lesions.   Neck:  Supple; no masses or thyromegaly. Lungs:  Clear throughout to auscultation.   No wheezes. Heart:  Regular rate and rhythm; no murmurs. Abdomen:  Soft, non-tender, nondistended, normal bowel sounds, no rebound or guarding.  Msk:  Symmetrical. No boney deformities LAD: No inguinal or umbilical LAD Extremities:  No clubbing or edema. Neurologic:  Alert and  oriented x4;  grossly nonfocal Skin:  No obvious rash or bruise. Psych:  Alert and cooperative. Normal mood and affect.      Amiliana Foutz L. Tarri Glenn, MD, MPH 03/04/2021, 3:37 PM

## 2021-03-04 NOTE — Progress Notes (Signed)
Pt Drowsy. VSS. To PACU, report to RN. No anesthetic complications noted.  

## 2021-03-04 NOTE — Patient Instructions (Addendum)
Read all of the handouts given to you by your recovery room nurse.  YOU HAD AN ENDOSCOPIC PROCEDURE TODAY AT [facility]:   Refer to the procedure report that was given to you for any specific questions about what was found during the examination.  If the procedure report does not answer your questions, please call your gastroenterologist to clarify.  If you requested that your care partner not be given the details of your procedure findings, then the procedure report has been included in a sealed envelope for you to review at your convenience later.  YOU SHOULD EXPECT: Some feelings of bloating in the abdomen. Passage of more gas than usual.  Walking can help get rid of the air that was put into your GI tract during the procedure and reduce the bloating. If you had a lower endoscopy (such as a colonoscopy or flexible sigmoidoscopy) you may notice spotting of blood in your stool or on the toilet paper. If you underwent a bowel prep for your procedure, you may not have a normal bowel movement for a few days.  Please Note:  You might notice some irritation and congestion in your nose or some drainage.  This is from the oxygen used during your procedure.  There is no need for concern and it should clear up in a day or so.  SYMPTOMS TO REPORT IMMEDIATELY:  Following lower endoscopy (colonoscopy or flexible sigmoidoscopy):  Excessive amounts of blood in the stool  Significant tenderness or worsening of abdominal pains  Swelling of the abdomen that is new, acute  Fever of 100F or higher   For urgent or emergent issues, a gastroenterologist can be reached at any hour by calling [phone]. Do not use MyChart messaging for urgent concerns.    DIET:  We do recommend a small meal at first, but then you may proceed to your regular diet.  Drink plenty of fluids but you should avoid alcoholic beverages for 24 hours. Try to eat vegetables, fruits and drink plenty of water.  Stay away  from red meat.  ACTIVITY:  You should plan to take it easy for the rest of today and you should NOT DRIVE or use heavy machinery until tomorrow (because of the sedation medicines used during the test).    FOLLOW UP: Our staff will call the number listed on your records 48-72 hours following your procedure to check on you and address any questions or concerns that you may have regarding the information given to you following your procedure. If we do not reach you, we will leave a message.  We will attempt to reach you two times.  During this call, we will ask if you have developed any symptoms of COVID 19. If you develop any symptoms (ie: fever, flu-like symptoms, shortness of breath, cough etc.) before then, please call [phone].  If you test positive for Covid 19 in the 2 weeks post procedure, please call and report this information to [place].    If any biopsies were taken you will be contacted by phone or by letter within the next 1-3 weeks.  Please call us at [phone] if you have not heard about the biopsies in 3 weeks.    SIGNATURES/CONFIDENTIALITY: You and/or your care partner have signed paperwork which will be entered into your electronic medical record.  These signatures attest to the fact that that the information above on your After Visit Summary has been reviewed and is understood.  Full responsibility of the  confidentiality of this discharge information lies with you and/or your care-partner.  

## 2021-03-04 NOTE — Progress Notes (Signed)
Called to room to assist during endoscopic procedure.  Patient ID and intended procedure confirmed with present staff. Received instructions for my participation in the procedure from the performing physician.  

## 2021-03-04 NOTE — Progress Notes (Signed)
VS by DT  Pt's states no medical or surgical changes since previsit or office visit.  

## 2021-03-08 ENCOUNTER — Telehealth: Payer: Self-pay

## 2021-03-08 ENCOUNTER — Telehealth: Payer: Self-pay | Admitting: *Deleted

## 2021-03-08 NOTE — Telephone Encounter (Signed)
Left message on follow up call. 

## 2021-03-08 NOTE — Telephone Encounter (Signed)
  Follow up Call-  Call back number 03/04/2021  Post procedure Call Back phone  # [phone]  Permission to leave phone message Yes  Some recent data might be hidden     Patient questions:  Do you have a fever, pain , or abdominal swelling? No. Pain Score  0 *  Have you tolerated food without any problems? Yes.    Have you been able to return to your normal activities? Yes.    Do you have any questions about your discharge instructions: Diet   No. Medications  No. Follow up visit  No.  Do you have questions or concerns about your Care? No.  Actions: * If pain score is 4 or above: No action needed, pain <4.   Have you developed a fever since your procedure? no  2.   Have you had an respiratory symptoms (SOB or cough) since your procedure? no  3.   Have you tested positive for COVID 19 since your procedure no  4.   Have you had any family members/close contacts diagnosed with the COVID 19 since your procedure?  no   If yes to any of these questions please route to Joylene [name], RN and Joella Prince, RN

## 2021-03-13 ENCOUNTER — Encounter: Payer: Self-pay | Admitting: Gastroenterology

## 2022-10-25 ENCOUNTER — Ambulatory Visit
Admission: RE | Admit: 2022-10-25 | Discharge: 2022-10-25 | Disposition: A | Discharge status: Home / Self Care | Payer: 59 | Source: Ambulatory Visit | Attending: Family Medicine | Admitting: Family Medicine

## 2022-10-25 ENCOUNTER — Other Ambulatory Visit: Payer: Self-pay | Admitting: Family Medicine

## 2022-10-25 DIAGNOSIS — M79675 Pain in left toe(s): Secondary | ICD-10-CM
